# Patient Record
Sex: Female | Born: 1935 | Race: White | Hispanic: No | Smoking: Former smoker
Health system: Southern US, Community
[De-identification: ages and names within clinical notes are randomized; demographics above are authoritative.]

## PROBLEM LIST (undated history)

## (undated) DIAGNOSIS — I10 Essential (primary) hypertension: Secondary | ICD-10-CM

## (undated) DIAGNOSIS — I442 Atrioventricular block, complete: Secondary | ICD-10-CM

## (undated) DIAGNOSIS — I4891 Unspecified atrial fibrillation: Secondary | ICD-10-CM

## (undated) DIAGNOSIS — I255 Ischemic cardiomyopathy: Secondary | ICD-10-CM

## (undated) DIAGNOSIS — G629 Polyneuropathy, unspecified: Secondary | ICD-10-CM

## (undated) DIAGNOSIS — I495 Sick sinus syndrome: Secondary | ICD-10-CM

## (undated) DIAGNOSIS — J449 Chronic obstructive pulmonary disease, unspecified: Secondary | ICD-10-CM

## (undated) DIAGNOSIS — I2510 Atherosclerotic heart disease of native coronary artery without angina pectoris: Secondary | ICD-10-CM

## (undated) DIAGNOSIS — C649 Malignant neoplasm of unspecified kidney, except renal pelvis: Secondary | ICD-10-CM

## (undated) DIAGNOSIS — K219 Gastro-esophageal reflux disease without esophagitis: Secondary | ICD-10-CM

## (undated) HISTORY — DX: Essential (primary) hypertension: I10

## (undated) HISTORY — DX: Ischemic cardiomyopathy: I25.5

## (undated) HISTORY — DX: Atrioventricular block, complete: I44.2

## (undated) HISTORY — DX: Malignant neoplasm of unspecified kidney, except renal pelvis: C64.9

## (undated) HISTORY — DX: Chronic obstructive pulmonary disease, unspecified: J44.9

## (undated) HISTORY — PX: CATARACT EXTRACTION: SUR2

## (undated) HISTORY — DX: Polyneuropathy, unspecified: G62.9

## (undated) HISTORY — DX: Gastro-esophageal reflux disease without esophagitis: K21.9

## (undated) HISTORY — DX: Unspecified atrial fibrillation: I48.91

## (undated) HISTORY — DX: Sick sinus syndrome: I49.5

## (undated) HISTORY — DX: Atherosclerotic heart disease of native coronary artery without angina pectoris: I25.10

---

## 2001-07-14 ENCOUNTER — Inpatient Hospital Stay (HOSPITAL_COMMUNITY): Admission: EM | Admit: 2001-07-14 | Discharge: 2001-07-20 | Payer: Self-pay

## 2001-07-14 ENCOUNTER — Encounter: Payer: Self-pay | Admitting: Internal Medicine

## 2001-07-14 HISTORY — PX: CORONARY ANGIOPLASTY WITH STENT PLACEMENT: SHX49

## 2001-07-15 ENCOUNTER — Encounter (INDEPENDENT_AMBULATORY_CARE_PROVIDER_SITE_OTHER): Payer: Self-pay | Admitting: Cardiology

## 2001-07-17 ENCOUNTER — Encounter: Payer: Self-pay | Admitting: Cardiovascular Disease

## 2003-10-31 ENCOUNTER — Emergency Department: Payer: Self-pay | Admitting: Emergency Medicine

## 2004-08-11 ENCOUNTER — Encounter: Payer: Self-pay | Admitting: Internal Medicine

## 2004-09-15 ENCOUNTER — Emergency Department (HOSPITAL_COMMUNITY): Admission: EM | Admit: 2004-09-15 | Discharge: 2004-09-15 | Payer: Self-pay | Admitting: Emergency Medicine

## 2005-01-28 ENCOUNTER — Inpatient Hospital Stay (HOSPITAL_COMMUNITY): Admission: RE | Admit: 2005-01-28 | Discharge: 2005-02-06 | Payer: Self-pay | Admitting: *Deleted

## 2005-01-29 HISTORY — PX: PERMANENT PACEMAKER INSERTION: SHX6023

## 2005-11-16 ENCOUNTER — Encounter: Payer: Self-pay | Admitting: Internal Medicine

## 2006-02-19 ENCOUNTER — Ambulatory Visit: Payer: Self-pay | Admitting: Internal Medicine

## 2006-02-26 ENCOUNTER — Ambulatory Visit: Payer: Self-pay | Admitting: Internal Medicine

## 2006-02-26 LAB — CONVERTED CEMR LAB
Albumin: 3.9 g/dL (ref 3.5–5.2)
CO2: 33 meq/L — ABNORMAL HIGH (ref 19–32)
Calcium: 10.1 mg/dL (ref 8.4–10.5)
Creatinine, Ser: 0.8 mg/dL (ref 0.4–1.2)
GFR calc Af Amer: 90 mL/min
Glucose, Bld: 102 mg/dL — ABNORMAL HIGH (ref 70–99)
Sodium: 140 meq/L (ref 135–145)
TSH: 0.07 microintl units/mL — ABNORMAL LOW (ref 0.35–5.50)

## 2006-03-05 ENCOUNTER — Ambulatory Visit: Payer: Self-pay | Admitting: Internal Medicine

## 2006-03-20 ENCOUNTER — Ambulatory Visit: Payer: Self-pay | Admitting: Internal Medicine

## 2006-03-27 ENCOUNTER — Ambulatory Visit: Payer: Self-pay | Admitting: Internal Medicine

## 2006-04-17 ENCOUNTER — Ambulatory Visit: Payer: Self-pay | Admitting: Internal Medicine

## 2006-04-29 ENCOUNTER — Ambulatory Visit: Payer: Self-pay | Admitting: Internal Medicine

## 2006-05-10 ENCOUNTER — Telehealth (INDEPENDENT_AMBULATORY_CARE_PROVIDER_SITE_OTHER): Payer: Self-pay | Admitting: *Deleted

## 2006-05-14 ENCOUNTER — Ambulatory Visit: Payer: Self-pay | Admitting: Internal Medicine

## 2006-05-15 ENCOUNTER — Ambulatory Visit: Payer: Self-pay | Admitting: Internal Medicine

## 2006-05-15 DIAGNOSIS — I4891 Unspecified atrial fibrillation: Secondary | ICD-10-CM

## 2006-05-15 LAB — CONVERTED CEMR LAB: INR: 2.6

## 2006-05-19 ENCOUNTER — Inpatient Hospital Stay (HOSPITAL_COMMUNITY): Admission: EM | Admit: 2006-05-19 | Discharge: 2006-05-23 | Payer: Self-pay | Admitting: Emergency Medicine

## 2006-05-19 ENCOUNTER — Ambulatory Visit: Payer: BLUE CROSS/BLUE SHIELD | Admitting: Emergency Medicine

## 2006-05-19 DIAGNOSIS — D0910 Carcinoma in situ of unspecified urinary organ: Secondary | ICD-10-CM | POA: Insufficient documentation

## 2006-05-20 ENCOUNTER — Encounter: Payer: Self-pay | Admitting: Internal Medicine

## 2006-05-21 ENCOUNTER — Encounter (INDEPENDENT_AMBULATORY_CARE_PROVIDER_SITE_OTHER): Payer: Self-pay | Admitting: Specialist

## 2006-05-21 ENCOUNTER — Ambulatory Visit: Payer: Self-pay | Admitting: Internal Medicine

## 2006-05-22 ENCOUNTER — Encounter (INDEPENDENT_AMBULATORY_CARE_PROVIDER_SITE_OTHER): Payer: Self-pay | Admitting: Specialist

## 2006-05-23 ENCOUNTER — Encounter: Payer: Self-pay | Admitting: Internal Medicine

## 2006-05-24 ENCOUNTER — Telehealth (INDEPENDENT_AMBULATORY_CARE_PROVIDER_SITE_OTHER): Payer: Self-pay | Admitting: *Deleted

## 2006-05-27 ENCOUNTER — Ambulatory Visit: Payer: Self-pay | Admitting: Internal Medicine

## 2006-05-27 DIAGNOSIS — G309 Alzheimer's disease, unspecified: Secondary | ICD-10-CM

## 2006-05-27 DIAGNOSIS — K219 Gastro-esophageal reflux disease without esophagitis: Secondary | ICD-10-CM

## 2006-05-27 DIAGNOSIS — R5383 Other fatigue: Secondary | ICD-10-CM

## 2006-05-27 DIAGNOSIS — M810 Age-related osteoporosis without current pathological fracture: Secondary | ICD-10-CM | POA: Insufficient documentation

## 2006-05-27 DIAGNOSIS — I2510 Atherosclerotic heart disease of native coronary artery without angina pectoris: Secondary | ICD-10-CM | POA: Insufficient documentation

## 2006-05-27 DIAGNOSIS — E785 Hyperlipidemia, unspecified: Secondary | ICD-10-CM | POA: Insufficient documentation

## 2006-05-27 DIAGNOSIS — F411 Generalized anxiety disorder: Secondary | ICD-10-CM | POA: Insufficient documentation

## 2006-05-27 DIAGNOSIS — R5381 Other malaise: Secondary | ICD-10-CM

## 2006-05-27 DIAGNOSIS — G589 Mononeuropathy, unspecified: Secondary | ICD-10-CM | POA: Insufficient documentation

## 2006-05-27 DIAGNOSIS — F0280 Dementia in other diseases classified elsewhere without behavioral disturbance: Secondary | ICD-10-CM

## 2006-05-27 DIAGNOSIS — I5022 Chronic systolic (congestive) heart failure: Secondary | ICD-10-CM | POA: Insufficient documentation

## 2006-05-28 LAB — CONVERTED CEMR LAB
BUN: 7 mg/dL (ref 6–23)
Basophils Absolute: 0 10*3/uL (ref 0.0–0.1)
CO2: 33 meq/L — ABNORMAL HIGH (ref 19–32)
Calcium: 9 mg/dL (ref 8.4–10.5)
GFR calc non Af Amer: 103 mL/min
Hemoglobin: 9.7 g/dL — ABNORMAL LOW (ref 12.0–15.0)
Lymphocytes Relative: 25.9 % (ref 12.0–46.0)
MCHC: 33.4 g/dL (ref 30.0–36.0)
MCV: 85.4 fL (ref 78.0–100.0)
Monocytes Absolute: 0.4 10*3/uL (ref 0.2–0.7)
Platelets: 170 10*3/uL (ref 150–400)
RBC: 3.39 M/uL — ABNORMAL LOW (ref 3.87–5.11)
Sodium: 143 meq/L (ref 135–145)
WBC: 2.9 10*3/uL — ABNORMAL LOW (ref 4.5–10.5)

## 2006-05-29 ENCOUNTER — Telehealth: Payer: Self-pay | Admitting: Internal Medicine

## 2006-05-30 ENCOUNTER — Ambulatory Visit: Payer: Self-pay | Admitting: Internal Medicine

## 2006-05-30 DIAGNOSIS — E876 Hypokalemia: Secondary | ICD-10-CM

## 2006-05-30 LAB — CONVERTED CEMR LAB: Potassium: 3.1 meq/L — ABNORMAL LOW (ref 3.5–5.1)

## 2006-06-04 ENCOUNTER — Telehealth (INDEPENDENT_AMBULATORY_CARE_PROVIDER_SITE_OTHER): Payer: Self-pay | Admitting: *Deleted

## 2006-06-06 ENCOUNTER — Encounter: Payer: Self-pay | Admitting: Internal Medicine

## 2006-06-11 ENCOUNTER — Telehealth (INDEPENDENT_AMBULATORY_CARE_PROVIDER_SITE_OTHER): Payer: Self-pay | Admitting: *Deleted

## 2006-06-13 ENCOUNTER — Inpatient Hospital Stay (HOSPITAL_COMMUNITY): Admission: RE | Admit: 2006-06-13 | Discharge: 2006-06-16 | Payer: Self-pay | Admitting: Urology

## 2006-06-13 ENCOUNTER — Encounter (INDEPENDENT_AMBULATORY_CARE_PROVIDER_SITE_OTHER): Payer: Self-pay | Admitting: Urology

## 2006-06-17 ENCOUNTER — Telehealth (INDEPENDENT_AMBULATORY_CARE_PROVIDER_SITE_OTHER): Payer: Self-pay | Admitting: *Deleted

## 2006-06-18 ENCOUNTER — Telehealth (INDEPENDENT_AMBULATORY_CARE_PROVIDER_SITE_OTHER): Payer: Self-pay | Admitting: *Deleted

## 2006-06-19 ENCOUNTER — Ambulatory Visit: Payer: Self-pay | Admitting: Internal Medicine

## 2006-06-19 DIAGNOSIS — I10 Essential (primary) hypertension: Secondary | ICD-10-CM

## 2006-06-25 ENCOUNTER — Encounter: Payer: Self-pay | Admitting: Internal Medicine

## 2006-07-29 ENCOUNTER — Ambulatory Visit: Payer: Self-pay | Admitting: Internal Medicine

## 2006-07-29 ENCOUNTER — Telehealth (INDEPENDENT_AMBULATORY_CARE_PROVIDER_SITE_OTHER): Payer: Self-pay | Admitting: *Deleted

## 2006-07-30 ENCOUNTER — Encounter: Payer: Self-pay | Admitting: Internal Medicine

## 2006-08-07 ENCOUNTER — Telehealth (INDEPENDENT_AMBULATORY_CARE_PROVIDER_SITE_OTHER): Payer: Self-pay | Admitting: *Deleted

## 2006-08-08 ENCOUNTER — Encounter (INDEPENDENT_AMBULATORY_CARE_PROVIDER_SITE_OTHER): Payer: Self-pay | Admitting: Urology

## 2006-08-08 ENCOUNTER — Ambulatory Visit (HOSPITAL_COMMUNITY): Admission: RE | Admit: 2006-08-08 | Discharge: 2006-08-08 | Payer: Self-pay | Admitting: Urology

## 2006-08-14 ENCOUNTER — Ambulatory Visit: Payer: Self-pay | Admitting: Internal Medicine

## 2006-08-14 LAB — CONVERTED CEMR LAB: INR: 1.7

## 2006-08-21 ENCOUNTER — Ambulatory Visit: Payer: Self-pay | Admitting: Internal Medicine

## 2006-08-21 LAB — CONVERTED CEMR LAB: Prothrombin Time: 16.8 s

## 2006-08-28 ENCOUNTER — Encounter: Payer: Self-pay | Admitting: Internal Medicine

## 2006-09-06 ENCOUNTER — Ambulatory Visit: Payer: Self-pay | Admitting: Internal Medicine

## 2006-10-02 ENCOUNTER — Ambulatory Visit: Payer: Self-pay | Admitting: Internal Medicine

## 2006-10-02 LAB — CONVERTED CEMR LAB
INR: 1.3
Prothrombin Time: 14.1 s

## 2006-10-16 ENCOUNTER — Ambulatory Visit: Payer: Self-pay | Admitting: Internal Medicine

## 2006-10-16 LAB — CONVERTED CEMR LAB: Prothrombin Time: 15.3 s

## 2006-10-30 ENCOUNTER — Ambulatory Visit: Payer: Self-pay | Admitting: Internal Medicine

## 2006-10-30 LAB — CONVERTED CEMR LAB: Prothrombin Time: 16.8 s

## 2006-10-31 ENCOUNTER — Ambulatory Visit: Payer: Self-pay | Admitting: Internal Medicine

## 2006-10-31 LAB — CONVERTED CEMR LAB
Albumin: 4.2 g/dL (ref 3.5–5.2)
Basophils Absolute: 0 10*3/uL (ref 0.0–0.1)
Basophils Relative: 0.3 % (ref 0.0–1.0)
CO2: 33 meq/L — ABNORMAL HIGH (ref 19–32)
Calcium: 10 mg/dL (ref 8.4–10.5)
Cholesterol: 164 mg/dL (ref 0–200)
Creatinine, Ser: 1 mg/dL (ref 0.4–1.2)
Eosinophils Absolute: 0.1 10*3/uL (ref 0.0–0.6)
Eosinophils Relative: 1.5 % (ref 0.0–5.0)
Glucose, Bld: 93 mg/dL (ref 70–99)
HCT: 36.9 % (ref 36.0–46.0)
HDL: 55.2 mg/dL (ref >39.0)
LDL Cholesterol: 82 mg/dL (ref 0–99)
MCV: 81.8 fL (ref 78.0–100.0)
Neutro Abs: 1.6 10*3/uL (ref 1.4–7.7)
Neutrophils Relative %: 48 % (ref 43.0–77.0)
Platelets: 134 10*3/uL — ABNORMAL LOW (ref 150–400)
RBC: 4.51 M/uL (ref 3.87–5.11)
TSH: 0.53 microintl units/mL (ref 0.35–5.50)

## 2006-11-27 ENCOUNTER — Ambulatory Visit: Payer: Self-pay | Admitting: Internal Medicine

## 2006-11-27 LAB — CONVERTED CEMR LAB: INR: 2

## 2006-12-16 ENCOUNTER — Ambulatory Visit (HOSPITAL_COMMUNITY): Admission: RE | Admit: 2006-12-16 | Discharge: 2006-12-16 | Payer: Self-pay | Admitting: Urology

## 2006-12-25 ENCOUNTER — Ambulatory Visit: Payer: Self-pay | Admitting: Internal Medicine

## 2006-12-25 LAB — CONVERTED CEMR LAB: INR: 1.1

## 2006-12-26 ENCOUNTER — Encounter: Payer: Self-pay | Admitting: Internal Medicine

## 2006-12-31 ENCOUNTER — Ambulatory Visit: Payer: Self-pay | Admitting: Internal Medicine

## 2007-01-13 ENCOUNTER — Encounter: Payer: Self-pay | Admitting: Internal Medicine

## 2007-01-27 ENCOUNTER — Ambulatory Visit: Payer: Self-pay | Admitting: Internal Medicine

## 2007-01-28 ENCOUNTER — Telehealth: Payer: Self-pay | Admitting: Internal Medicine

## 2007-02-07 ENCOUNTER — Ambulatory Visit: Payer: BLUE CROSS/BLUE SHIELD

## 2007-02-11 ENCOUNTER — Ambulatory Visit: Payer: Self-pay | Admitting: Internal Medicine

## 2007-02-27 ENCOUNTER — Telehealth (INDEPENDENT_AMBULATORY_CARE_PROVIDER_SITE_OTHER): Payer: Self-pay | Admitting: *Deleted

## 2007-03-12 ENCOUNTER — Ambulatory Visit: Payer: Self-pay | Admitting: Internal Medicine

## 2007-03-12 LAB — CONVERTED CEMR LAB
INR: 3.4
Prothrombin Time: 22.4 s

## 2007-03-26 ENCOUNTER — Ambulatory Visit: Payer: Self-pay | Admitting: Internal Medicine

## 2007-03-26 LAB — CONVERTED CEMR LAB: Prothrombin Time: 23 s

## 2007-04-07 ENCOUNTER — Telehealth (INDEPENDENT_AMBULATORY_CARE_PROVIDER_SITE_OTHER): Payer: Self-pay | Admitting: *Deleted

## 2007-04-08 ENCOUNTER — Telehealth (INDEPENDENT_AMBULATORY_CARE_PROVIDER_SITE_OTHER): Payer: Self-pay | Admitting: *Deleted

## 2007-04-10 ENCOUNTER — Ambulatory Visit: Payer: Self-pay | Admitting: Internal Medicine

## 2007-05-08 ENCOUNTER — Ambulatory Visit: Payer: Self-pay | Admitting: Internal Medicine

## 2007-05-08 LAB — CONVERTED CEMR LAB

## 2007-06-05 ENCOUNTER — Ambulatory Visit: Payer: Self-pay | Admitting: Internal Medicine

## 2007-06-09 LAB — CONVERTED CEMR LAB: Prothrombin Time: 16.7 s

## 2007-06-18 ENCOUNTER — Ambulatory Visit (HOSPITAL_COMMUNITY): Admission: RE | Admit: 2007-06-18 | Discharge: 2007-06-18 | Payer: Self-pay | Admitting: Urology

## 2007-06-19 ENCOUNTER — Ambulatory Visit: Payer: Self-pay | Admitting: Internal Medicine

## 2007-06-19 LAB — CONVERTED CEMR LAB: Prothrombin Time: 19.9 s

## 2007-06-27 ENCOUNTER — Encounter: Payer: Self-pay | Admitting: Internal Medicine

## 2007-07-10 ENCOUNTER — Encounter: Payer: Self-pay | Admitting: Internal Medicine

## 2007-07-17 ENCOUNTER — Ambulatory Visit: Payer: Self-pay | Admitting: Internal Medicine

## 2007-07-17 LAB — CONVERTED CEMR LAB: INR: 3.6

## 2007-07-31 ENCOUNTER — Ambulatory Visit: Payer: Self-pay | Admitting: Internal Medicine

## 2007-07-31 LAB — CONVERTED CEMR LAB: INR: 2.8

## 2007-08-14 ENCOUNTER — Ambulatory Visit: Payer: Self-pay | Admitting: Internal Medicine

## 2007-08-14 LAB — CONVERTED CEMR LAB
INR: 4.4 — ABNORMAL HIGH (ref 0.8–1.0)
Prothrombin Time: 28.7 s
Prothrombin Time: 43.9 s — ABNORMAL HIGH (ref 10.9–13.3)

## 2007-08-18 ENCOUNTER — Telehealth (INDEPENDENT_AMBULATORY_CARE_PROVIDER_SITE_OTHER): Payer: Self-pay | Admitting: *Deleted

## 2007-08-18 ENCOUNTER — Ambulatory Visit: Payer: Self-pay | Admitting: Internal Medicine

## 2007-08-18 LAB — CONVERTED CEMR LAB
INR: 1.3
Prothrombin Time: 13.9 s

## 2007-08-25 ENCOUNTER — Ambulatory Visit: Payer: Self-pay | Admitting: Internal Medicine

## 2007-08-25 LAB — CONVERTED CEMR LAB: INR: 1.2

## 2007-09-08 ENCOUNTER — Ambulatory Visit: Payer: Self-pay | Admitting: Internal Medicine

## 2007-09-22 ENCOUNTER — Ambulatory Visit: Payer: Self-pay | Admitting: Internal Medicine

## 2007-09-22 LAB — CONVERTED CEMR LAB
INR: 1.5
Prothrombin Time: 15.1 s

## 2007-10-06 ENCOUNTER — Ambulatory Visit: Payer: Self-pay | Admitting: Internal Medicine

## 2007-10-06 LAB — CONVERTED CEMR LAB: INR: 1.8

## 2007-10-13 ENCOUNTER — Encounter: Payer: Self-pay | Admitting: Internal Medicine

## 2007-10-20 ENCOUNTER — Ambulatory Visit: Payer: Self-pay | Admitting: Internal Medicine

## 2007-10-20 LAB — CONVERTED CEMR LAB
INR: 3.9
Prothrombin Time: 24 s

## 2007-10-27 ENCOUNTER — Telehealth (INDEPENDENT_AMBULATORY_CARE_PROVIDER_SITE_OTHER): Payer: Self-pay | Admitting: *Deleted

## 2007-11-04 ENCOUNTER — Ambulatory Visit: Payer: Self-pay | Admitting: Internal Medicine

## 2007-11-18 ENCOUNTER — Ambulatory Visit: Payer: Self-pay | Admitting: Internal Medicine

## 2007-11-18 LAB — CONVERTED CEMR LAB
INR: 1.6
Prothrombin Time: 15.4 s

## 2007-11-19 LAB — CONVERTED CEMR LAB
Albumin: 4.1 g/dL (ref 3.5–5.2)
Basophils Absolute: 0 10*3/uL (ref 0.0–0.1)
Bilirubin, Direct: 0.1 mg/dL (ref 0.0–0.3)
CO2: 30 meq/L (ref 19–32)
Chloride: 104 meq/L (ref 96–112)
Cholesterol: 163 mg/dL (ref 0–200)
Eosinophils Absolute: 0 10*3/uL (ref 0.0–0.7)
Eosinophils Relative: 1.2 % (ref 0.0–5.0)
GFR calc Af Amer: 69 mL/min
GFR calc non Af Amer: 57 mL/min
HCT: 38 % (ref 36.0–46.0)
HDL: 47.7 mg/dL (ref >39.0)
MCHC: 33.2 g/dL (ref 30.0–36.0)
MCV: 88.1 fL (ref 78.0–100.0)
Monocytes Absolute: 0.3 10*3/uL (ref 0.1–1.0)
Platelets: 124 10*3/uL — ABNORMAL LOW (ref 150–400)
Potassium: 4.8 meq/L (ref 3.5–5.1)
Total Protein: 7 g/dL (ref 6.0–8.3)
Triglycerides: 145 mg/dL (ref 0–149)
VLDL: 29 mg/dL (ref 0–40)
WBC: 3.7 10*3/uL — ABNORMAL LOW (ref 4.5–10.5)

## 2007-12-02 ENCOUNTER — Ambulatory Visit: Payer: Self-pay | Admitting: Internal Medicine

## 2007-12-02 LAB — CONVERTED CEMR LAB
INR: 1.5
Prothrombin Time: 15.1 s

## 2007-12-16 ENCOUNTER — Telehealth: Payer: Self-pay | Admitting: Internal Medicine

## 2007-12-16 ENCOUNTER — Ambulatory Visit: Payer: Self-pay | Admitting: Internal Medicine

## 2007-12-16 LAB — CONVERTED CEMR LAB: Prothrombin Time: 14.3 s

## 2007-12-22 ENCOUNTER — Ambulatory Visit (HOSPITAL_COMMUNITY): Admission: RE | Admit: 2007-12-22 | Discharge: 2007-12-22 | Payer: Self-pay | Admitting: Urology

## 2007-12-26 ENCOUNTER — Encounter: Payer: Self-pay | Admitting: Internal Medicine

## 2007-12-30 ENCOUNTER — Ambulatory Visit: Payer: Self-pay | Admitting: Internal Medicine

## 2007-12-30 ENCOUNTER — Ambulatory Visit: Payer: Self-pay | Admitting: Family Medicine

## 2007-12-30 DIAGNOSIS — N390 Urinary tract infection, site not specified: Secondary | ICD-10-CM | POA: Insufficient documentation

## 2008-01-05 ENCOUNTER — Telehealth: Payer: Self-pay | Admitting: Internal Medicine

## 2008-01-06 ENCOUNTER — Ambulatory Visit: Payer: Self-pay | Admitting: Internal Medicine

## 2008-01-06 DIAGNOSIS — K625 Hemorrhage of anus and rectum: Secondary | ICD-10-CM

## 2008-01-13 ENCOUNTER — Ambulatory Visit: Payer: Self-pay | Admitting: Internal Medicine

## 2008-01-13 LAB — CONVERTED CEMR LAB
INR: 1.9
Prothrombin Time: 16.9 s

## 2008-01-27 ENCOUNTER — Ambulatory Visit: Payer: Self-pay | Admitting: Internal Medicine

## 2008-01-27 LAB — CONVERTED CEMR LAB
INR: 1.3
Prothrombin Time: 14.3 s

## 2008-02-04 ENCOUNTER — Ambulatory Visit: Payer: Self-pay | Admitting: Internal Medicine

## 2008-02-05 LAB — CONVERTED CEMR LAB
AST: 18 units/L (ref 0–37)
Albumin: 3.9 g/dL (ref 3.5–5.2)
Basophils Absolute: 0 10*3/uL (ref 0.0–0.1)
Basophils Relative: 0.5 % (ref 0.0–3.0)
Cholesterol: 144 mg/dL (ref 0–200)
Eosinophils Absolute: 0.1 10*3/uL (ref 0.0–0.7)
HCT: 36.6 % (ref 36.0–46.0)
Hemoglobin: 12.5 g/dL (ref 12.0–15.0)
Lymphocytes Relative: 31 % (ref 12.0–46.0)
MCHC: 34.1 g/dL (ref 30.0–36.0)
MCV: 87.4 fL (ref 78.0–100.0)
Monocytes Absolute: 0.3 10*3/uL (ref 0.1–1.0)
Neutro Abs: 2.4 10*3/uL (ref 1.4–7.7)
RBC: 4.19 M/uL (ref 3.87–5.11)
TSH: 0.62 microintl units/mL (ref 0.35–5.50)

## 2008-02-18 ENCOUNTER — Ambulatory Visit: Payer: Self-pay | Admitting: Internal Medicine

## 2008-02-24 ENCOUNTER — Ambulatory Visit: Payer: Self-pay | Admitting: Family Medicine

## 2008-02-24 DIAGNOSIS — S2239XA Fracture of one rib, unspecified side, initial encounter for closed fracture: Secondary | ICD-10-CM

## 2008-03-02 ENCOUNTER — Encounter: Payer: Self-pay | Admitting: Internal Medicine

## 2008-03-02 ENCOUNTER — Telehealth: Payer: Self-pay | Admitting: Internal Medicine

## 2008-03-03 ENCOUNTER — Telehealth: Payer: Self-pay | Admitting: Internal Medicine

## 2008-03-04 ENCOUNTER — Telehealth: Payer: Self-pay | Admitting: Internal Medicine

## 2008-03-11 ENCOUNTER — Telehealth: Payer: Self-pay | Admitting: Internal Medicine

## 2008-03-11 ENCOUNTER — Encounter: Payer: Self-pay | Admitting: Internal Medicine

## 2008-03-12 ENCOUNTER — Encounter: Payer: Self-pay | Admitting: Internal Medicine

## 2008-03-15 ENCOUNTER — Telehealth: Payer: Self-pay | Admitting: Internal Medicine

## 2008-04-02 ENCOUNTER — Telehealth: Payer: Self-pay | Admitting: Internal Medicine

## 2008-04-13 ENCOUNTER — Ambulatory Visit: Payer: Self-pay | Admitting: Internal Medicine

## 2008-04-13 LAB — CONVERTED CEMR LAB: Prothrombin Time: 17.1 s

## 2008-04-21 ENCOUNTER — Encounter: Payer: Self-pay | Admitting: Internal Medicine

## 2008-05-04 ENCOUNTER — Ambulatory Visit: Payer: Self-pay | Admitting: Internal Medicine

## 2008-05-04 DIAGNOSIS — R32 Unspecified urinary incontinence: Secondary | ICD-10-CM

## 2008-05-11 ENCOUNTER — Ambulatory Visit: Payer: Self-pay | Admitting: Internal Medicine

## 2008-05-11 LAB — CONVERTED CEMR LAB: Prothrombin Time: 19.7 s

## 2008-06-08 ENCOUNTER — Ambulatory Visit: Payer: Self-pay | Admitting: Internal Medicine

## 2008-06-08 LAB — CONVERTED CEMR LAB: INR: 1.9

## 2008-06-14 ENCOUNTER — Ambulatory Visit (HOSPITAL_COMMUNITY): Admission: RE | Admit: 2008-06-14 | Discharge: 2008-06-14 | Payer: Self-pay | Admitting: Urology

## 2008-07-06 ENCOUNTER — Ambulatory Visit: Payer: Self-pay | Admitting: Family Medicine

## 2008-07-06 LAB — CONVERTED CEMR LAB: Prothrombin Time: 17.3 s

## 2008-07-29 ENCOUNTER — Ambulatory Visit: Payer: Self-pay | Admitting: Internal Medicine

## 2008-07-29 LAB — CONVERTED CEMR LAB: INR: 2.2

## 2008-08-15 ENCOUNTER — Inpatient Hospital Stay (HOSPITAL_COMMUNITY): Admission: EM | Admit: 2008-08-15 | Discharge: 2008-08-23 | Payer: Self-pay | Admitting: Emergency Medicine

## 2008-08-15 ENCOUNTER — Ambulatory Visit: Payer: Self-pay | Admitting: Internal Medicine

## 2008-08-16 ENCOUNTER — Telehealth: Payer: Self-pay | Admitting: Internal Medicine

## 2008-08-22 ENCOUNTER — Ambulatory Visit: Payer: Self-pay | Admitting: Oncology

## 2008-08-22 ENCOUNTER — Encounter: Payer: Self-pay | Admitting: Internal Medicine

## 2008-08-26 ENCOUNTER — Ambulatory Visit: Payer: Self-pay | Admitting: Gastroenterology

## 2008-08-26 ENCOUNTER — Telehealth: Payer: Self-pay | Admitting: Internal Medicine

## 2008-08-26 ENCOUNTER — Ambulatory Visit: Payer: Self-pay | Admitting: Oncology

## 2008-08-28 ENCOUNTER — Ambulatory Visit: Payer: BLUE CROSS/BLUE SHIELD | Admitting: Family Medicine

## 2008-08-31 ENCOUNTER — Emergency Department: Payer: BLUE CROSS/BLUE SHIELD | Admitting: Emergency Medicine

## 2008-09-27 ENCOUNTER — Ambulatory Visit: Payer: Self-pay | Admitting: Oncology

## 2008-09-29 ENCOUNTER — Encounter: Payer: Self-pay | Admitting: Internal Medicine

## 2008-09-29 LAB — CBC & DIFF AND RETIC
Basophils Absolute: 0 10*3/uL (ref 0.0–0.1)
EOS%: 5 % (ref 0.0–7.0)
HCT: 36.8 % (ref 34.8–46.6)
HGB: 11.8 g/dL (ref 11.6–15.9)
Immature Retic Fract: 6.9 % (ref 0.00–10.70)
MCH: 28.2 pg (ref 25.1–34.0)
MCV: 88 fL (ref 79.5–101.0)
NEUT%: 69.3 % (ref 38.4–76.8)
Platelets: 193 10*3/uL (ref 145–400)
lymph#: 1 10*3/uL (ref 0.9–3.3)

## 2008-09-29 LAB — MORPHOLOGY

## 2008-09-29 LAB — ERYTHROCYTE SEDIMENTATION RATE: Sed Rate: 22 mm/hr (ref 0–30)

## 2008-09-30 LAB — COMPREHENSIVE METABOLIC PANEL
Alkaline Phosphatase: 71 U/L (ref 39–117)
BUN: 15 mg/dL (ref 6–23)
Glucose, Bld: 130 mg/dL — ABNORMAL HIGH (ref 70–99)
Sodium: 140 mEq/L (ref 135–145)
Total Bilirubin: 0.6 mg/dL (ref 0.3–1.2)

## 2008-09-30 LAB — LACTATE DEHYDROGENASE: LDH: 228 U/L (ref 94–250)

## 2009-09-12 IMAGING — CR DG CHEST 2V
2 series · 2 of 2 positions shown · non-contrast
Comparison: 05/21/06.

CLINICAL DATA: Renal cell cancer. Assess for metastatic disease.
 CHEST - 2 VIEW:

[w chest pa]
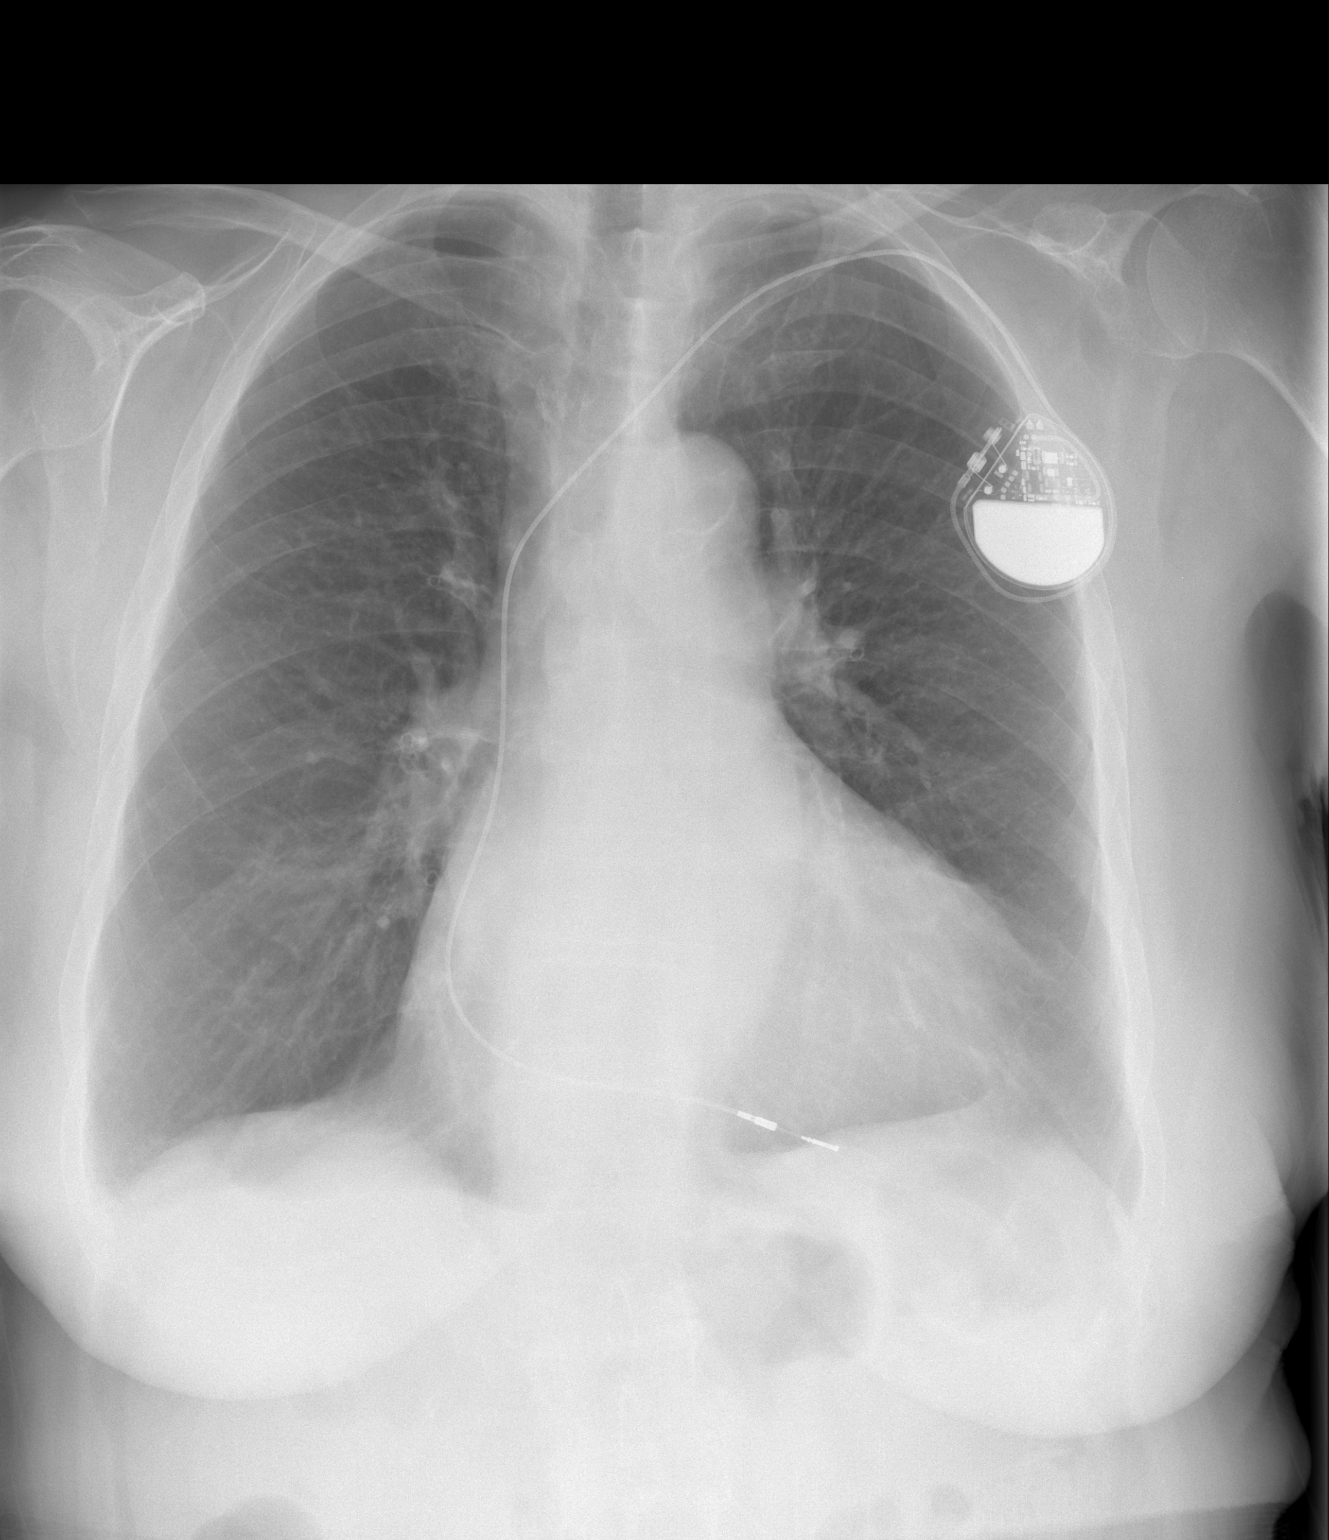

[w chest lat]
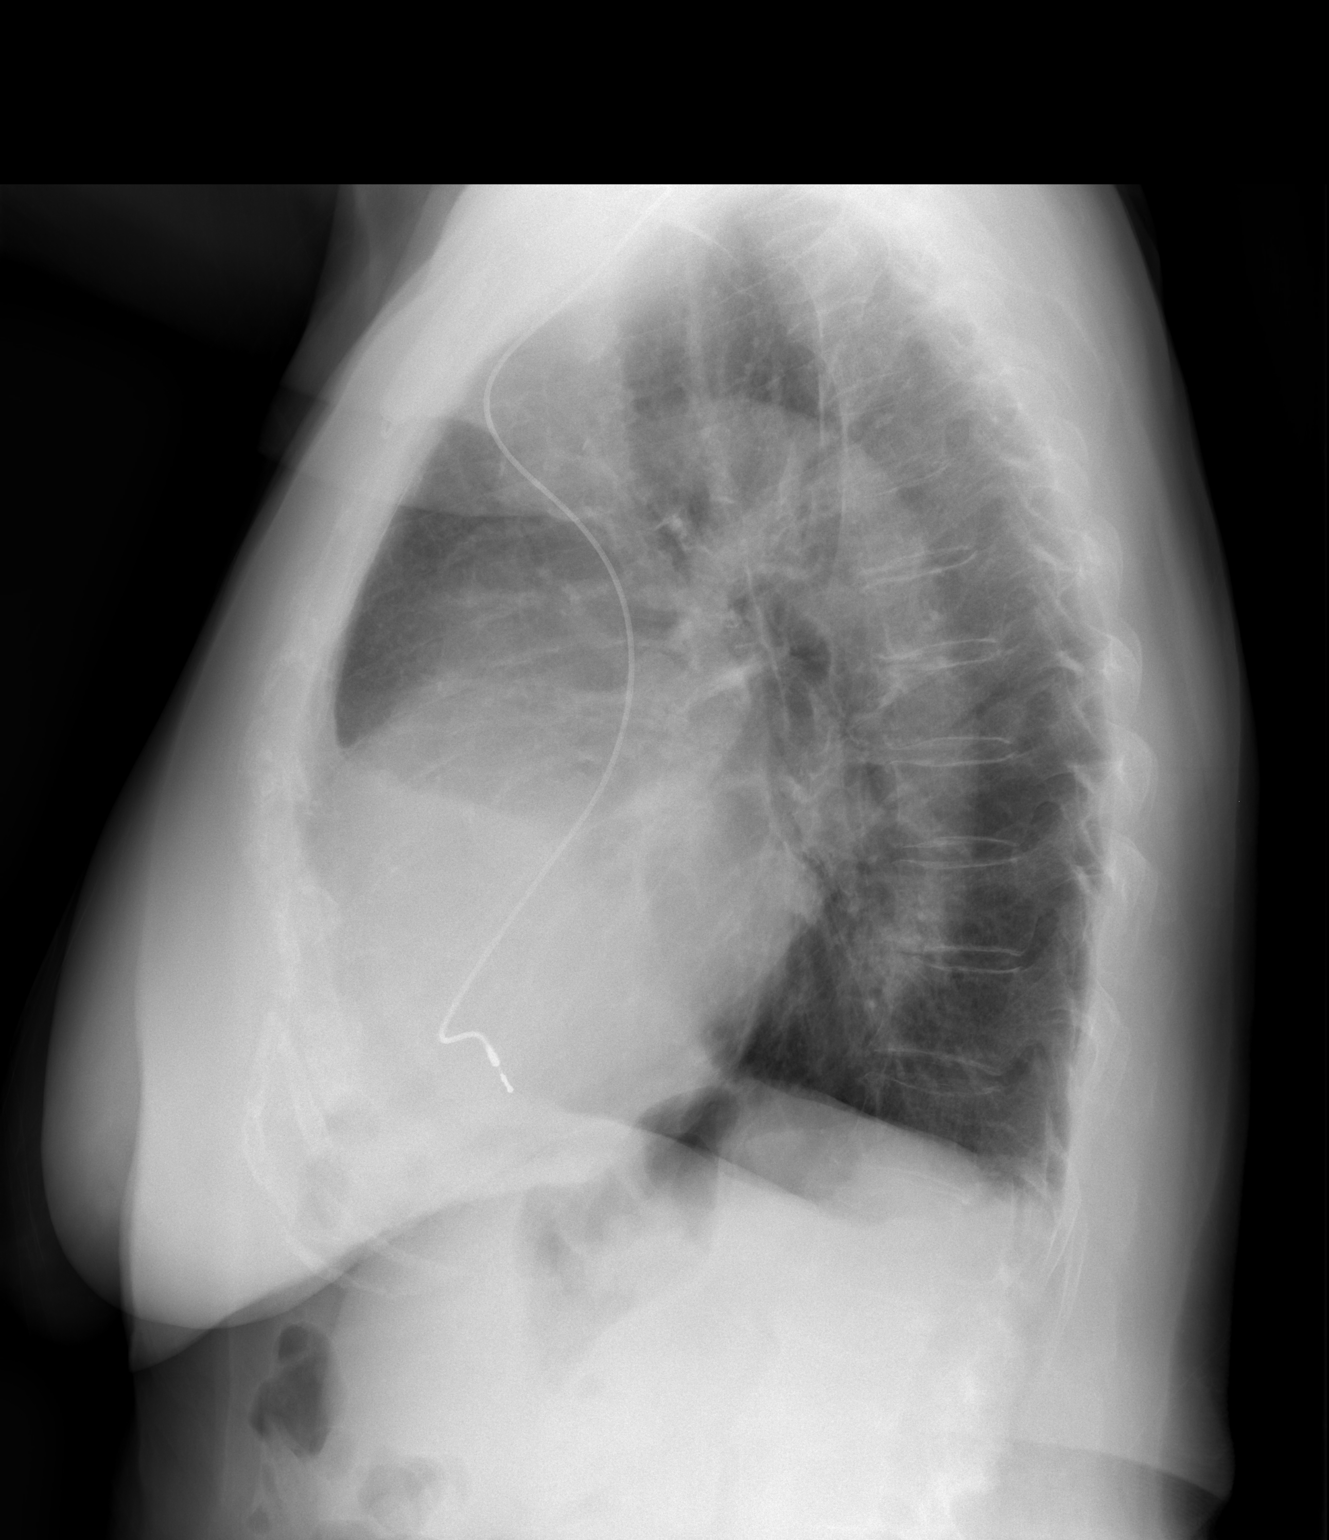

[2 of 2 positions shown; findings below may reference images not displayed]

FINDINGS: Single lead pacemaker appears the same. Cardiomegaly persists.  Pleural and parenchymal scarring on the left appear the same. No sign of metastatic disease or any active process.
IMPRESSION: 1.  No evidence of metastatic disease.
 2.  Cardiomegaly and pleural scarring on the left.

## 2010-01-29 ENCOUNTER — Encounter: Payer: Self-pay | Admitting: Internal Medicine

## 2010-03-16 HISTORY — PX: NM MYOCAR PERF WALL MOTION: HXRAD629

## 2010-04-15 LAB — BASIC METABOLIC PANEL
BUN: 16 mg/dL (ref 6–23)
CO2: 29 mEq/L (ref 19–32)
Chloride: 103 mEq/L (ref 96–112)
Creatinine, Ser: 1.07 mg/dL (ref 0.4–1.2)
Glucose, Bld: 125 mg/dL — ABNORMAL HIGH (ref 70–99)
Potassium: 3.9 mEq/L (ref 3.5–5.1)

## 2010-04-15 LAB — CBC
HCT: 23.6 % — ABNORMAL LOW (ref 36.0–46.0)
HCT: 26.1 % — ABNORMAL LOW (ref 36.0–46.0)
HCT: 29.7 % — ABNORMAL LOW (ref 36.0–46.0)
HCT: 35.2 % — ABNORMAL LOW (ref 36.0–46.0)
HCT: 36.5 % (ref 36.0–46.0)
MCHC: 34.7 g/dL (ref 30.0–36.0)
MCHC: 33.6 g/dL (ref 30.0–36.0)
MCHC: 34.1 g/dL (ref 30.0–36.0)
MCHC: 34.2 g/dL (ref 30.0–36.0)
MCHC: 34.7 g/dL (ref 30.0–36.0)
MCV: 84.7 fL (ref 78.0–100.0)
MCV: 87.3 fL (ref 78.0–100.0)
MCV: 87.5 fL (ref 78.0–100.0)
MCV: 87.7 fL (ref 78.0–100.0)
Platelets: 145 10*3/uL — ABNORMAL LOW (ref 150–400)
Platelets: 109 10*3/uL — ABNORMAL LOW (ref 150–400)
Platelets: 110 10*3/uL — ABNORMAL LOW (ref 150–400)
Platelets: 126 10*3/uL — ABNORMAL LOW (ref 150–400)
Platelets: 134 10*3/uL — ABNORMAL LOW (ref 150–400)
RBC: 2.69 MIL/uL — ABNORMAL LOW (ref 3.87–5.11)
RBC: 2.98 MIL/uL — ABNORMAL LOW (ref 3.87–5.11)
RDW: 13.2 % (ref 11.5–15.5)
RDW: 12.9 % (ref 11.5–15.5)
RDW: 13.1 % (ref 11.5–15.5)
WBC: 3.2 10*3/uL — ABNORMAL LOW (ref 4.0–10.5)
WBC: 4.7 10*3/uL (ref 4.0–10.5)
WBC: 5.2 10*3/uL (ref 4.0–10.5)
WBC: 5.4 10*3/uL (ref 4.0–10.5)

## 2010-04-15 LAB — APTT
aPTT: 50 seconds — ABNORMAL HIGH (ref 24–37)
aPTT: 29 seconds (ref 24–37)
aPTT: 34 seconds (ref 24–37)
aPTT: 41 seconds — ABNORMAL HIGH (ref 24–37)
aPTT: 43 seconds — ABNORMAL HIGH (ref 24–37)
aPTT: 51 seconds — ABNORMAL HIGH (ref 24–37)

## 2010-04-15 LAB — DIFFERENTIAL
Basophils Absolute: 0 10*3/uL (ref 0.0–0.1)
Eosinophils Absolute: 0.1 10*3/uL (ref 0.0–0.7)
Eosinophils Relative: 2 % (ref 0–5)
Eosinophils Relative: 0 % (ref 0–5)
Lymphocytes Relative: 14 % (ref 12–46)
Lymphs Abs: 0.8 10*3/uL (ref 0.7–4.0)
Lymphs Abs: 0.7 10*3/uL (ref 0.7–4.0)
Monocytes Absolute: 0.4 10*3/uL (ref 0.1–1.0)
Monocytes Relative: 11 % (ref 3–12)
Neutro Abs: 4.4 10*3/uL (ref 1.7–7.7)
Neutrophils Relative %: 61 % (ref 43–77)

## 2010-04-15 LAB — COMPREHENSIVE METABOLIC PANEL
ALT: 20 U/L (ref 0–35)
AST: 21 U/L (ref 0–37)
AST: 21 U/L (ref 0–37)
Albumin: 2.6 g/dL — ABNORMAL LOW (ref 3.5–5.2)
Albumin: 4 g/dL (ref 3.5–5.2)
BUN: 23 mg/dL (ref 6–23)
CO2: 27 mEq/L (ref 19–32)
Calcium: 9 mg/dL (ref 8.4–10.5)
Calcium: 9.3 mg/dL (ref 8.4–10.5)
Chloride: 101 mEq/L (ref 96–112)
Creatinine, Ser: 1.02 mg/dL (ref 0.4–1.2)
GFR calc Af Amer: >60 mL/min (ref >60)
GFR calc Af Amer: >60 mL/min (ref >60)
GFR calc non Af Amer: 52 mL/min — ABNORMAL LOW (ref >60)
Sodium: 136 mEq/L (ref 135–145)
Total Bilirubin: 0.7 mg/dL (ref 0.3–1.2)
Total Protein: 5.7 g/dL — ABNORMAL LOW (ref 6.0–8.3)

## 2010-04-15 LAB — RETICULOCYTES
RBC.: 3.49 MIL/uL — ABNORMAL LOW (ref 3.87–5.11)
Retic Count, Absolute: 35.8 10*3/uL (ref 19.0–186.0)
Retic Ct Pct: 1.3 % (ref 0.4–3.1)

## 2010-04-15 LAB — PREPARE FRESH FROZEN PLASMA

## 2010-04-15 LAB — CLOSTRIDIUM DIFFICILE EIA

## 2010-04-15 LAB — PROTIME-INR
INR: 2.9 — ABNORMAL HIGH (ref 0.00–1.49)
Prothrombin Time: 15.5 seconds — ABNORMAL HIGH (ref 11.6–15.2)
Prothrombin Time: 19 seconds — ABNORMAL HIGH (ref 11.6–15.2)
Prothrombin Time: 26.5 seconds — ABNORMAL HIGH (ref 11.6–15.2)
Prothrombin Time: 29.8 seconds — ABNORMAL HIGH (ref 11.6–15.2)

## 2010-04-15 LAB — CROSSMATCH: ABO/RH(D): A POS

## 2010-04-15 LAB — VITAMIN B12: Vitamin B-12: 295 pg/mL (ref 211–911)

## 2010-04-15 LAB — DIRECT ANTIGLOBULIN TEST (NOT AT ARMC)
DAT, IgG: NEGATIVE
DAT, complement: NEGATIVE

## 2010-04-15 LAB — GLUCOSE, CAPILLARY: Glucose-Capillary: 91 mg/dL (ref 70–99)

## 2010-04-15 LAB — FECAL LACTOFERRIN, QUANT: Fecal Lactoferrin: POSITIVE

## 2010-05-23 NOTE — Discharge Summary (Signed)
NAMESNOW, PEOPLES               ACCOUNT NO.:  [id number]   MEDICAL RECORD NO.:  [id number]          PATIENT TYPE:  INP   LOCATION:  4703                         FACILITY:  [facility]   PHYSICIAN:  Gordy Savers, MDDATE OF BIRTH:  1928/10/27   DATE OF ADMISSION:  05/19/2006  DATE OF DISCHARGE:  05/23/2006                               DISCHARGE SUMMARY   DISCHARGE DIAGNOSES:  1. Gross hematuria with left renal mass on CT, likely renal cell      carcinoma.  Plan for a laparoscopic left radical nephrectomy as an      outpatient per Dr. Crecencio Mc.  2. Thyroid nodules noted on ultrasound at previous practice per      daughter, at which time it was recommended that the patient have a      follow-up ultrasound performed in 6 months, will defer to the      patient's primary care  3. Chronic diarrhea/dysphagia status post colonoscopy and endoscopy      performed by Dr. Stan Head on May 22, 2006.  Status post empiric      dilation for dysphagia.   HISTORY OF PRESENT ILLNESS:  Ms. Gadway is a 77 year old white female who  was admitted on May 19, 2006, with a chief complaint of gross hematuria.  She as a history of atrial fibrillation and has been maintained on  Coumadin prior to this admission.  She initially presented to [facility] where they recommended possible hospitalization.  She  was admitted for further evaluation and treatment.   PAST MEDICAL HISTORY:  1. Atrial fibrillation on Coumadin.  2. History of bradycardia status post permanent pacemaker placement.  3. History of coronary artery disease.  4. History of MI.  5. Status post PTCA 2003 with stent per Dr. Tresa Endo, her cardiologist.  6. Depression/anxiety.  7. Memory loss/dementia.  8. History of peripheral neuropathy.  9. Osteoporosis.  10.Bilateral cataract surgery.   HOSPITAL COURSE:  PROBLEM #1 - GROSS HEMATURIA:  The patient was  admitted and was seen in consultation by Dr. Annabell Howells of Urology.   She  underwent a CT of the abdomen and pelvis which noted a left renal mass  most consistent with renal cell carcinoma.  Incidentally, a right  ovarian dermoid was noted.  This was followed by cystoscopy.  It was  felt that the patient would need a nephrectomy.  At this time plan is  for the patient to undergo a nephrectomy which will be performed  laparoscopically by Dr. Crecencio Mc, and at this time will continue to  maintain the patient off of Coumadin but will add aspirin with plan to  hold aspirin for 5 days prior to surgery.   PROBLEM #2 -  DYSPHASIA/CHRONIC DIARRHEA:  The patient was currently  undergoing outpatient evaluation for these issues at [facility],  however, they were postponed due to the patient being on Coumadin.  As  her Coumadin was held during this hospitalization, we have requested GI  to perform these tests while the patient was off of Coumadin.  Endoscopy  was normal.  She underwent  an empiric dilation if it would help with her  dysphasia.  Colonoscopy noted internal hemorrhoids, otherwise was  unremarkable.   DISCHARGE LABORATORY DATA:  Hemoglobin 9, hematocrit 26.5.  BUN 7,  creatinine 0.58.   DISCHARGE MEDICATIONS:  1. Lexapro 10 mg p.o. daily.  2. Aricept 5 mg p.o. daily in the evening.  3. Norvasc 5 mg p.o. daily.  4. Vytorin 10/40 1 tablet p.o. daily.  5. Toprol XL 50 mg p.o. daily.  6. Diovan 160 mg p.o. daily as before.  7. Imodium as needed for diarrhea.  8. Lasix 80 mg p.o. daily.  9. Aspirin 325 mg p.o. daily.  The patient is instructed to hold aspirin 5 days prior to surgery.  In  addition, the patient is instructed to hold Coumadin until further  instructions.   DISPOSITION:  The patient will be discharged to home.   FOLLOW UP:  The patient is to follow up with Dr. Crecencio Mc next week  and contact the office for time.  She is also instructed to follow up  with Dr. Tillman Abide in 1-2 weeks.      Sandford Craze, NP       Gordy Savers, MD  Electronically Signed    MO/MEDQ  D:  05/23/2006  T:  05/23/2006  Job:  [id number]   cc:   Crecencio Mc, M.D.  Karie Schwalbe, MD  Excell Seltzer. Annabell Howells, M.D.

## 2010-05-23 NOTE — Consult Note (Signed)
NAMESEVILLE, BRICK               ACCOUNT NO.:  [id number]   MEDICAL RECORD NO.:  [id number]          PATIENT TYPE:  INP   LOCATION:  4735                         FACILITY:  [facility]   PHYSICIAN:  Genene Churn. Granfortuna, M.D.DATE OF BIRTH:  02-May-1928   DATE OF CONSULTATION:  08/22/2008  DATE OF DISCHARGE:                                 CONSULTATION   This is a hematology consultation requested to evaluate this lady for a  sudden unexplained fall in her hemoglobin with no obvious signs of  bleeding.   Mrs. Seres is a 77 year old woman with known cardiac disease status post  MI in 2003 and subsequent development of sick sinus syndrome requiring  permanent pacemaker placement in January 2007.  She is on chronic  Coumadin anticoagulation for atrial fibrillation.  She has developed an  ischemic cardiomyopathy with an estimated ejection fraction of 30-35%.   She presented in May 2008 with gross hematuria and was found to have a  cancer of the left kidney.  She underwent a laparoscopic left  nephrectomy on June 13, 2006 by Dr. Heloise Purpura.   She was admitted here on August 15, 2008 after she tripped and fell at  home and fractured her right ankle.  She was felt to be a surgical  candidate.  Her Coumadin was held.  She was bridged with Lovenox.  She  underwent an open reduction and internal fixation procedure on the right  ankle on August 18, 2008.  She tolerated the surgery well.  Estimated  blood loss was minimal at about 150 mL.   Preop on August 15, 2008 hemoglobin 12.7, hematocrit 36.5, white count  5400, platelets 134,000.  Post hydration on August 17, 2008 hemoglobin  was 11.8, hematocrit 35.  Postop on August 19, 2008 hemoglobin down to  10 grams, then 8.9 grams by August 21, 2008 and 8 grams today.  Over the  same interval white count fell from 5400 to3200, platelets have  fluctuated from initial value of 134,000 to 109, 000 on August 19, 2008  and back up to 120,000 today.   Previous blood counts recorded from August 07, 2006 hemoglobin 11.7,  hematocrit 34, white count 2900, platelets 141,000.  On July 07, 2005,  hemoglobin 11, hematocrit 32, white count 2900, platelets 138,000.  On  January 28, 2005 hemoglobin 13, hematocrit 39, white count 3800 and  platelets 169,000.   She was typed in crossmatch for blood today.  There were no abnormal  antibodies detected.  Blood type is A, Rh positive.  On admission,  bilirubin was normal at 0.7.  Additional lab today with B12 295, folic  acid 10.4, iron 37 slightly decreased and ferritin 111.   She is still on Lovenox 80 mg q.12 hours and Coumadin has been resumed  at 6 mg nightly.   ADDITIONAL MEDICATIONS:  1. Norvasc 5 mg daily.  2. Aricept 5 mg daily.  3. Lexapro 10 mg daily.  4. Benicar 20 mg b.i.d.  5. Protonix 40 mg b.i.d.   NEW MEDICATIONS:  Protonix, perioperative Ancef, p.r.n. Ambien,  OxyContin, and Lovenox.   ALLERGIES:  No known medication allergies.   No blood transfusions given during this admission.   Yesterday, she developed gastritis with nausea, vomiting and diarrhea.  She does have some history of irritable bowel syndrome.  She did have a  colonoscopy in 2008 around the time of her diagnosis of kidney cancer,  which according to her daughter did not show any gross pathology.   FAMILY HISTORY:  There is no family history of any bleeding disorders or  anemias.   PHYSICAL EXAMINATION:  GENERAL:  An elderly woman with poor memory.  Most of her past medical history is provided by her daughter who  accompanies her today and from the chart record.  VITAL SIGNS:  She is afebrile and has been afebrile since admission.  Blood pressure is 113/54, pulse 67 and regular, respirations 20.  SKIN:  Pale.  There is a 2 x 2 cm ecchymosis on the right flank.  There  are some small ecchymoses at sites of venipuncture.  HEENT:  The pharynx no erythema or exudate.  LUNGS:  Clear.  HEART:  Regular cardiac  rhythm.  No murmur.  Pacemaker battery in the  subcutaneous tissues left subclavian position.  ABDOMEN:  Soft, obese, nontender or minimally tender in the epigastric  region.  No obvious mass or organomegaly.  EXTREMITIES:  Left lower  extremity no calf, edema or swelling.  Right lower extremity she has a  cast on up to just below her knee.  NEUROLOGIC:  Grossly normal except  for poor memory.   Review of the peripheral blood from today shows normochromic normocytic  red blood cells.  No polychromasia.  No schistocytes.  Neutrophils  appear hypogranular.  There was a rare atypical monocyte and occasional  lymphocyte has cytoplasmic projections.  These findings are nonspecific.   IMPRESSION:  1. Subacute fall in hemoglobin since August 18, 2008 surgery with most      of the fall over the last 48 hours.   Although there has been no gross clinical bleeding, she did have some  surgical losses and she has developed a gastritis since she has been in  the hospital.  There is no gross evidence for any acute hemolytic  process or any major hemorrhage.  She has been on anticoagulation since  admission which may also contribute to some blood loss.   I cannot rule out a medication effect, but she does not appear to be on  any of the usual offending medications that cause anemia.   1. Chronic mild fluctuating pancytopenia.  My guess is that she may      have a early, mild, myelodysplastic syndrome given the mild      pancytopenia and hypogranular neutrophils on peripheral blood film.      I suspect that her white count usually runs low.  It has been as      low as 2900 in the past and that with the stress of surgery she has      had a transient increase into the normal range.  Although her      platelet count is also mildly decreased, this has also fluctuated      in the past but has never been lower than 100,000.   RECOMMENDATIONS:  I think it is reasonable to proceed with a blood   transfusion in view of her underlying cardiac history.   When gastritis subsides, I would give her oral iron replacement to cover  recent surgical losses.  Serum iron is slightly decreased.  Ferritin is  normal but this is likely an acute phase reactant.   I would consider holding or stopping her proton pump inhibitor  just in  case this is exerting some negative effect on her blood production and  substitute with liquid antacid for her gastritis.   I will check some parameters to exclude hemolysis but I doubt this both  clinically and from review of the peripheral blood.   I will be happy to follow her after discharge if her blood counts  remained low.  I discussed this with her and her daughter.      Genene Churn. Cyndie Chime, M.D.  Electronically Signed     JMG/MEDQ  D:  08/22/2008  T:  08/22/2008  Job:  [id number]   cc:   Willow Ora, MD

## 2010-05-23 NOTE — Op Note (Signed)
NAMEKATIANNE, BARRE               ACCOUNT NO.:  [id number]   MEDICAL RECORD NO.:  [id number]          PATIENT TYPE:  INP   LOCATION:  4735                         FACILITY:  [facility]   PHYSICIAN:  Toni Arthurs, MD        DATE OF BIRTH:  1928-12-27   DATE OF PROCEDURE:  08/15/2008  DATE OF DISCHARGE:                               OPERATIVE REPORT   PREOPERATIVE DIAGNOSIS:  Right bimalleolar ankle fracture.   POSTOPERATIVE DIAGNOSIS:  Right bimalleolar ankle fracture.   PROCEDURE:  Open reduction and internal fixation of right bimalleolar  ankle fracture.   SURGEON:  Toni Arthurs, MD   ANESTHESIA:  General IV fluids.  See anesthesia record.   ESTIMATED BLOOD LOSS:  10 mL.   TOURNIQUET TIME:  2 hours at 300 mmHg.   COMPLICATIONS:  None apparent.   DRAINS:  Small Hemovac x2.   DISPOSITION:  Extubated, awake, and stable to recovery.   INDICATIONS FOR PROCEDURE:  The patient is a 77 year old female who  turned her ankle while trying to turn around in the bathroom.  She  presented to the emergency room where x-rays revealed a bimalleolar  ankle fracture that was displaced.  She presents now for operative  treatment of this injury.  She understands the risks and benefits of  this procedure and elects to proceed.   PROCEDURE IN DETAIL:  After preoperative consent was obtained a correct  operative site was identified, the patient was brought was the operating  room and placed supine on the operating table.  General anesthesia was  induced.  Preoperative antibiotics were administered.  Surgical time-out  was taken.  The right lower extremity was prepped and draped in standard  sterile fashion with tourniquet around the proximal thigh.  Medial and  lateral incisions were marked on the skin in longitudinal fashion.  The  extremity was exsanguinated and the patient tourniquet was inflated to  300 mmHg.  The medial incision was made and dissection was carried down  to the fracture site.   The fracture site was exposed and the periosteum  was excised allowing visualization of the fracture line.  The anterior  ankle joint could be seen and there was no significant damage to the  talar cartilage.  The medial malleolar fracture was noted to have severe  comminution at the metaphyseal area, and the patient's bone quality was  noted to be extremely poor.  A 1.6-mm guidepin was inserted from the tip  of the medial malleolus up into the metaphyseal bone after reducing the  fracture.  Attention was then turned to the lateral aspect of the ankle  where the previously marked incision was made.  A blunt dissection was  again carried down to the fracture site.  The fracture was exposed and  reduced.  A DePuy anatomic fibular plate with 4 holes proximally was  selected and applied to the lateral aspect of the fibula.  AP lateral  and oblique views of the ankle showed appropriate reduction of the  fractures and appropriate length of the plate.  The plate was then  secured to  the distal fibula with the pin and clamped to the proximal  fibula with a lobster-claw.  The appropriate position was again verified  with fluoroscopy.  The most proximal of the distal locking screws was  selected and drilled a nonlocking bicortical 3.5 mm fully threaded screw  was inserted to pull the plate down flush to the bone.  The oblong hole  on the proximal end of the plate was then selected and drilled for a  bicortical screw.  A 3.5 mm fully threaded screw was noted to have poor  purchase.  This was switched up to a 4-mm fully threaded cancellous  screw that was inserted in bicortical fashion and was noted to have a an  expectable amount of purchase.  The remaining holes in the distal end of  the plate were then drilled and filled with locking 3.5 mm fully  threaded screws and locking 4.0 fully threaded cortical screws.  The  remaining 2 holes in the proximal end of the plate were filled with 3.5  mm fully  threaded bicortical screws.  The remaining nonlocking hole was  also filled with a 3.5 mm fully threaded bicortical screw.  The pin was  removed.  X-rays again showed appropriate reduction of the fibular  fracture and appropriate position and length of all hardware.  Attention  was then returned to the medial malleolus.  The previously inserted  guidewire was augmented with a second parallel guidewire just posterior  to the first.  The comminuted area proximally was noted to be somewhat  unstable, so a transverse pin was inserted through this area and was  used to secure the fracture in its reduced position.  Both distal screws  were drilled and filled with fully threaded 4.0 mm cancellous screws  that were 50 and 60 mm long.  At this point, the transverse pin was  removed and it was felt that the metaphyseal bone was still somewhat  unstable.  A 2 hole one-third tubular plate was selected and applied in  buttress fashion over the fracture line.  It was secured to the distal  tibial bone with bicortical 3.5 mm fully threaded screws.  AP mortise  and lateral views of the ankle then showed appropriate reduction of both  fractures and appropriate position and length of all hardware.  Both  wounds were then irrigated copiously.  Small Hemovac drains were placed  in the deep layers of each wound.  Prior to placing the drain, the  periosteum was closed over hardware medially and laterally.  The  subcutaneous tissue was then approximated with inverted simple sutures  of 3-0 Vicryl.  A running 3-0 Prolene suture was used to close both  incisions.  Sterile dressings were applied followed by a well-padded  short leg splint.  The patient was then awakened from anesthesia and  transported to recovery room in stable condition.      Toni Arthurs, MD  Electronically Signed     JH/MEDQ  D:  08/18/2008  T:  08/19/2008  Job:  [id number]

## 2010-05-23 NOTE — H&P (Signed)
[name], [name]               ACCOUNT NO.:  [id number]   MEDICAL RECORD NO.:  [id number]          PATIENT TYPE:  INP   LOCATION:  1826                         FACILITY:  [facility]   PHYSICIAN:  Barbette Hair. Artist Pais, DO      DATE OF BIRTH:  [birth year]   DATE OF ADMISSION:  05/19/2006  DATE OF DISCHARGE:                              HISTORY & PHYSICAL   PRIMARY CARE PHYSICIAN:  Karie Schwalbe, MD   CHIEF COMPLAINT:  Gross hematuria.   HISTORY OF PRESENT ILLNESS:  The patient is a 77 year old white female  with past medical history of coronary artery disease, atrial  fibrillation on Coumadin who presents with gross hematuria.  Her  symptoms started on Saturday morning when she noticed bright red blood  after urinating.  She normally wears pads due to urinary incontinence.  Later that day and into Saturday evening, she did not notice any further  bleeding.  She awoke this morning with recurrence of significant gross  hematuria.  She currently lives with her daughter who took her to  [facility].  They recommended possible hospitalization.  She denies any associated symptoms.  No report of weakness, chest pain  or shortness of breath.   She denies any previous episodes of hematuria, and she was a smoker in  the past.  She quit tobacco in 2003, but has greater than 20 pack year  history.  She was recently evaluated by Dr. Leone Payor of [facility] due to  dysphagia and intermittent looses stools.  A barium swallow was planned  for Monday with possible follow up EGD and colonoscopy.   PAST MEDICAL HISTORY:  1. Atrial fibrillation on Coumadin.  2. History of bradycardia status post pacer.  3. History of coronary artery disease.  4. History of MI.  5. Status post PTCA 2003 with stent by Dr. Tresa Endo, her cardiologist.  6. Depression/anxiety.  7. Memory loss/dementia.  8. History of peripheral neuropathy.  9. Osteoporosis.  10.Bilateral cataract surgery.   SOCIAL HISTORY:   The patient is widowed, lives with her daughter  Marcelino Duster.  There is no alcohol or tobacco use as noted above.  She has  two other sons.   FAMILY HISTORY:  Mother had emphysema.  Brother and sister with  alcoholism.  Father had Hodgkin's disease.  Another brother with  pancreatic cancer.   CURRENT MEDICATIONS:  1. Lasix 80 mg once daily.  2. Lexapro 10 mg once daily.  3. Aricept 5 mg once daily.  4. Norvasc 5 mg once daily.  5. Vytorin 10/40 once daily.  6. Coumadin.  7. Toprol XL 50 mg once daily.  8. Diovan 160 mg once daily.  9. Imodium p.r.n.  10.Pepcid A.C. p.r.n.   ALLERGIES:  None known.   REVIEW OF SYSTEMS:  Positive urinary frequency but no dysuria.  No early  satiety, but does have abdominal distention with urination, some fecal  incontinence.  All other systems negative.   LABORATORY DATA:  CBC showed WBC 4.4, H&H 12.7 and 37.3, platelets 171.  Comprehensive metabolic profile notable for sodium 139, potassium 3.9,  BUN 25, creatinine 0.8, blood sugar 93.  LFT's were normal.  Venous pH  was 7.365, PCO2 46.6 and bicarbonate 26.6.  UA showed gross hematuria,  positive nitrites and positive leukocyte esterase.   PHYSICAL EXAMINATION:  VITAL SIGNS:  Temperature 96.6, blood pressure  144/66, pulse 65, respirations 20, 97% on room air.  GENERAL:  The patient is a very pleasant 77 year old white female,  awake, alert, no apparent distress.  HEENT:  Normocephalic, atraumatic.  Pupils equal and reactive to light  bilaterally.  Evidence of previous bilateral iridectomy.  Normal  conjunctivae.  NECK:  Supple.  No adenopathy.  No carotid bruit.  No thyromegaly.  CHEST:  Normal respiratory effort.  Clear to auscultation bilaterally.  No rhonchi, rales or wheezing.  CARDIOVASCULAR:  Regular rate and rhythm.  No significant murmurs, rubs  or gallops appreciated.  ABDOMEN:  Protuberant, soft, nontender, positive bowel sounds.  The  patient did not have any flank or CVA  tenderness.  EXTREMITIES:  No cyanosis, clubbing or edema.  The patient had palpable  pedal dorsalis pulses.  SKIN:  Warm and dry.  NEUROLOGICAL:  Cranial nerves 2-12 was intact.  She was nonfocal.   Examination of her Foley catheter revealed a fair amount of  approximately 200-300 cc of gross, bloody fluid in her Foley bag.   IMPRESSION/RECOMMENDATIONS:  1. Gross hematuria.  2. Urinary tract infection.  3. Coagulopathy.  4. Atrial fibrillation.  5. Coronary artery disease.  6. Hypertension.  7. Hyperlipidemia.  8. Anxiety/depression.  9. History of tobacco abuse.   RECOMMENDATIONS:  The patient will be admitted for IV fluids.  We will  hold her Coumadin and start IV Cefazolin for UTI.  Her hematuria may be  due to cystitis.  However, due to her smoking history, we need to rule  out bladder lesion.  We will consult urology.   Due to her vague abdominal complaints, she is to follow up with Dr.  Leone Payor as long as her creatinine stay stable.  Consider CT of the  abdomen and pelvis.      Barbette Hair. Artist Pais, DO  Electronically Signed     RDY/MEDQ  D:  05/19/2006  T:  05/20/2006  Job:  [id number]   cc:   Karie Schwalbe, MD

## 2010-05-23 NOTE — Assessment & Plan Note (Signed)
[facility]                         GASTROENTEROLOGY OFFICE NOTE   NAME:[name], [name]                      MRN:          [id number]  DATE:05/14/2006                            DOB:          Nov 05, 1928    REFERRING PHYSICIAN:  Karie Schwalbe, MD   GASTROENTEROLOGY CONSULTATION:   REASON FOR CONSULTATION:  Change in bowel habits with diarrhea.   ASSESSMENT:  1. This is a 77 year old white woman who has had some chronic urgent      defecation problems, but they are much worse.  She is having      incontinence and loose stools regardless of what she eats.  Imodium      does help it, and she uses this as needed.  Differential diagnosis      include irritable bowel syndrome, inflammatory bowel disease,      colorectal neoplasia/cancer.  2. Chronic intermittent dysphagia.  It has not been helped by Nexium.      Question dysmotility, question esophageal stricture or ring.  3. Chronic Coumadin therapy for atrial fibrillation, followed by Dr.      Daphene Jaeger.   RECOMMENDATIONS AND PLAN:  1. She is going to need a colonoscopy.  Risks, benefits and      indications are explained.  2. An upper GI endoscopy with possible esophageal dilation is      considered, but I want to do a barium swallow with tablet first to      try to learn more about her anatomy to try to determine how to      approach this.  3. Try Imodium every day, backing off if it causes constipation.  4. We will contact the patient and her daughter once the results of      the barium swallow and Dr. Landry Dyke recommendation regarding holding      Coumadin 5 days prior to the procedure return.  This should be      okay, but there is some increased risk of stroke, which raises the      risk of her procedures, when she comes off Coumadin; however, I      think it would be optimal to perform these procedures off of      Coumadin.   HISTORY:  A [age]-year-old white woman with problems  as above.  She has tried to determine what foods bother her; she says just a leaf  of lettuce will make her go to the bathroom with urgent crampy  defecation and in the setting of a worsening peripheral neuropathy, she  is having more difficulty reaching the bathroom and having incontinence.  If she has a couple of bad days, she will use some Imodium and she will  be okay for a couple of days it sounds like.  She also has some urinary  incontinence for the past 5 years.  In addition, she chokes very easily  for years and what she describes as a suprasternal impact dysphagia and  regurgitation of things like chicken.  Her weight has actually gone up;  she is not losing weight.  She is not  describing rectal bleeding.  GI  review of systems otherwise as above or as reflected in my medical  history form.   CURRENT MEDICATIONS:  1. Aricept 5 mg daily.  2. Norvasc 5 mg daily.  3. Lexapro 10 mg daily.  4. Toprol-XL 50 mg daily.  5. Coumadin as directed.  6. Furosemide 80 mg daily.  7. Diovan 160 mg daily.  8. Vytorin 10/40 daily.  9. Tylenol Extra Strength at bedtime.  10.Imodium p.r.n.  11.Tums p.r.n.  12.Pepcid AC p.r.n.   DRUG ALLERGIES:  None known.   PAST MEDICAL HISTORY:  1. Coronary artery disease with history of myocardial infarction.  2. Atrial fibrillation.  3. Congestive heart failure with ejection fraction of 35%, but then      improvement.  4. Peripheral neuropathy.  5. Alzheimer's dementia, on medication for about 3 years.  6. Gastroesophageal reflux disease (belching).  7. Osteoporosis.  8. Pacemaker.  9. It sounds like she has had a percutaneous coronary intervention in      2003 as well with stent (Dr. Daphene Jaeger).  10.Osteoarthritis.  11.Cataract surgery x2.   FAMILY HISTORY:  Mother had emphysema.  Brother and sisters have  alcoholism.  Father had Hodgkin's disease and a sister had Hodgkin's  disease.  A brother had carcinoma of the pancreas.   SOCIAL  HISTORY:  She is widowed with 2 sons and 1 daughter.  She lives  with her daughter and is here with her today.  She quit smoking in 2003.  No alcohol use.   REVIEW OF SYSTEMS:  As above.  A lot of back pain, some cough.  She uses  a walker to ambulate.  She has pedal edema.  She feels hoarse at times.  Note that she was on Nexium for a while and it did not seem to help the  belching or the dysphagia and it was costly, so it was discontinued and  there really seems to be no change in her symptom complex with that.  Review of systems otherwise negative or as reflected in my medial  history form.   PHYSICAL EXAMINATION:  A well-developed elderly white woman.  Height 5 feet 7 inches, weight 175 pounds.  Blood pressure 110/70, pulse  72 and regular today.  HEENT: The eyes are anicteric.  ENT:  Normal mouth and posterior  pharynx, with dentures.  NECK:  Supple without thyromegaly or mass.  CHEST:  Clear.  CARDIAC:  S1 and S2.  I hear no rubs, murmurs or gallops and appears to  be in a normal rhythm today.  ABDOMEN:  Soft and nontender without organomegaly or mass.  RECTAL:  Exam is deferred.  LYMPHATICS:  No neck or supraclavicular lymph nodes palpated.  EXTREMITIES:  Lower extremities show 1+ lower extremity edema.  SKIN:  Warm and dry without acute rash.  NEUROLOGIC/PSYCHIATRIC:  She is awake and alert and oriented to person  and place.  She thought it was 2007 and not 2008.   LABORATORY DATA:  From February 26, 2006 shows a glucose of 102,  otherwise normal electrolytes with albumin 3.9.  Her TSH was 0.07, but  her free T4 was 1.5 with normal thyroid function.  Pulmonary function  tests showed moderately severe restrictive lung disease.   I have also reviewed Dr. Karle Starch problem list and office notes from  April 29 2006.  He listed anxiety as one of her problems as well.  She  was on Lexapro and still is.    Baldo Ash  Sena Slate, MD,FACG  Electronically Signed    CEG/MedQ  DD:  05/14/2006  DT: 05/14/2006  Job #: [id number]   cc:   Karie Schwalbe, MD  Nicki Guadalajara, M.D.

## 2010-05-23 NOTE — Assessment & Plan Note (Signed)
[facility]                                 ON-CALL NOTE   NAME:INGLECici, [name]                        MRN:          [id number]  DATE:05/19/2006                            DOB:          [birth year]    Patient of Dr. Alphonsus Sias.   [name] calls from [phone] at 3:44 p.m. on May 19, 2006  stating her mom had blood in her urine and was not feeling well today.  I recommended she take her to an Urgent Care or emergency room to be  evaluated.     [name], DO  Electronically Signed    Shawnie Dapper  DD: 05/19/2006  DT: 05/20/2006  Job #: [id number]   cc:   Karie Schwalbe, MD

## 2010-05-23 NOTE — Op Note (Signed)
NAMEKATELEE, SCHUPP               ACCOUNT NO.:  [id number]   MEDICAL RECORD NO.:  [id number]          PATIENT TYPE:  INP   LOCATION:  4703                         FACILITY:  [facility]   PHYSICIAN:  Excell Seltzer. Annabell Howells, M.D.    DATE OF BIRTH:  12-15-1928   DATE OF PROCEDURE:  05/21/2006  DATE OF DISCHARGE:  05/23/2006                               OPERATIVE REPORT   PROCEDURE:  Flexible cystoscopy.   PREOPERATIVE DIAGNOSES:  1. Hematuria.  2. Left renal mass.   POSTOPERATIVE DIAGNOSES:  1. Hematuria.  2. Left renal mass.   SURGEON:  Excell Seltzer. Annabell Howells, M.D.   ANESTHESIA:  Local.   INDICATIONS:  Ms. Westrich as a 77 year old white female with hematuria  that is likely from a large left upper pole renal mass, but it was felt  that bladder tumors needed to be ruled out.   FINDINGS AT PROCEDURE:  The patient was placed in the supine position  with her legs froglegged.  She was prepped with Betadine solution, and a  16-French flexible cystoscope was inserted per urethra.  Examination  revealed a normal urethra.  The bladder wall was smooth and pale, with  the exception of some irritation from her Foley catheter.  No tumors  were seen.  No stones were seen.  The ureteral orifices were  unremarkable.  The scope was removed.  There were no complications  during the procedure.      Excell Seltzer. Annabell Howells, M.D.  Electronically Signed     JJW/MEDQ  D:  08/01/2006  T:  08/02/2006  Job:  [id number]   cc:   Barbette Hair. Artist Pais, DO

## 2010-05-23 NOTE — Assessment & Plan Note (Signed)
[facility]                                 ON-CALL NOTE   NAME:[name], [name]                        MRN:          [id number]  DATE:05/23/2006                            DOB:          [birth year]    TIME OF INTERACTION:  At 6:36 p.m.   PHONE NUMBER:  [phone]   The caller was Alferd Patee, the daughter.   OBJECTIVE:  The caller's mother [name] was discharged from the  hospital earlier this afternoon. She was there four days at [facility]. She  was admitted for hematuria and found to have renal cell carcinoma and is  scheduled for surgery in June. She also has atrial fibrillation and was  on Coumadin until Saturday night at which time it was stopped and she is  now on an aspirin a day. The patient came home and now when she walks to  the bathroom, which is next door to her room, she ends up panting and  breaking out in a sweat from the exertion. As far as the daughter knows,  there was no problem with breathing in the hospital. As I said, she has  atrial fibrillation and has been on Coumadin until Saturday. When she is  not walking around and exerting herself, she breathes reasonably  normally.   ASSESSMENT:  Shortness of breath with exertion.   PLAN:  Possibly combination of debility and anemia from the bleeding may  make her short of breath at this time. If it worsens or if it continues,  I would have the patient re-evaluate it at the hospital. If the patient  gets acutely worse, would call 911 and have the patient transported to  the hospital. The patient's daughter may be able to call tomorrow to  find out more information on discharge and the condition of the patient  as far as anemia and shortness of breath is concerned while she was in  the hospital.   PRIMARY CARE PHYSICIAN:  Karie Schwalbe, MD at home office of [facility]     Arta Silence, MD  Electronically Signed    RNS/MedQ  DD: 05/23/2006  DT: 05/23/2006  Job #:  [id number]   cc:   Karie Schwalbe, MD

## 2010-05-23 NOTE — Op Note (Signed)
NAMEBRIANNY, [name]               ACCOUNT NO.:  [id number]   MEDICAL RECORD NO.:  [id number]          PATIENT TYPE:  AMB   LOCATION:  DAY                          FACILITY:  [facility]   PHYSICIAN:  Heloise Purpura, MD      DATE OF BIRTH:  07/10/1928   DATE OF PROCEDURE:  08/08/2006  DATE OF DISCHARGE:                               OPERATIVE REPORT   PREOPERATIVE DIAGNOSIS:  Bladder lesion.   POSTOPERATIVE DIAGNOSIS:  Bladder lesion.   PROCEDURE:  1. Cystoscopy.  2. Bladder biopsy.   SURGEON:  Heloise Purpura, M.D.   ANESTHESIA:  General.   COMPLICATIONS:  None.   SPECIMENS:  Bladder biopsy.   DISPOSITION:  Specimen to pathology.   INDICATIONS:  [name] is a 77 year old lady with a history of  hematuria and a left renal cell carcinoma who is status post a left  laparoscopic radical nephrectomy.  As part of her hematuria evaluation,  she did undergo cystoscopy, which demonstrated a submucosal nodular  lesion towards the dome of the bladder.  She presents today for further  evaluation and biopsy of this lesion.  Potential risks, benefits and  alternative options were discussed with the patient, and informed  consent was obtained.   DESCRIPTION OF PROCEDURE:  The patient was taken to the operating room,  and a laryngeal mask anesthetic was administered.  She was given  preoperative antibiotics, placed in the dorsal lithotomy position, and  prepped and draped in the usual sterile fashion.  Next, a preoperative  time-out was performed.  Cystourethroscopy was then performed with a 30  degree lens which allowed systematic inspection of the bladder.  The  ureteral orifices were noted in normal anatomic position.  The right  ureteral orifice was noted to be effluxing clear urine.  The bladder was  then examined systematically, and there was noted to be a submucosal  nodular lesion just to the left of the dome of the bladder.  This was  biopsied with the cold-cup biopsy forceps, and a  nodular lesion was  removed.  This was able to be completely removed with the biopsy forceps  and the bladder resection site was then fulgurated with the  Bugbee electrode.  The patient's bladder was the emptied and  reinspected, and hemostasis was insured.  The bladder was then emptied,  and the procedure was ended.  The patient tolerated the procedure well  without complications.  She was able to be awakened and transferred to  the recovery unit in satisfactory condition.      Heloise Purpura, MD  Electronically Signed     LB/MEDQ  D:  08/08/2006  T:  08/08/2006  Job:  [id number]

## 2010-05-23 NOTE — Discharge Summary (Signed)
[name], [name]               ACCOUNT NO.:  [id number]   MEDICAL RECORD NO.:  [id number]          PATIENT TYPE:  INP   LOCATION:  4735                         FACILITY:  [facility]   PHYSICIAN:  Lonia Blood, M.D.       DATE OF BIRTH:  July 20, 1928   DATE OF ADMISSION:  08/15/2008  DATE OF DISCHARGE:  08/23/2008                               DISCHARGE SUMMARY   ADDENDUM   The patient will be provided with a prescription for OxyContin 10 mg  p.o. q.12 h., dispensed 10 with 0 refills, to be sent with her to the  skilled nursing facility.      Sandford Craze, NP      Lonia Blood, M.D.  Electronically Signed    MO/MEDQ  D:  08/23/2008  T:  08/24/2008  Job:  [id number]

## 2010-05-23 NOTE — Consult Note (Signed)
NAMEAQUILLA, SHAMBLEY               ACCOUNT NO.:  [id number]   MEDICAL RECORD NO.:  [id number]          PATIENT TYPE:  INP   LOCATION:  4735                         FACILITY:  [facility]   PHYSICIAN:  Anselmo Rod, M.D.  DATE OF BIRTH:  1928-06-16   DATE OF CONSULTATION:  08/22/2008  DATE OF DISCHARGE:                                 CONSULTATION   REASON FOR CONSULTATION:  Gradual drop in hemoglobin.   PHYSICIAN REQUESTING CONSULT:  Willow Ora, MD.   ASSESSMENT:  1. Anemia with leukopenia and thrombocytopenia, guaiac negative stool      in 77 year old white female with multiple medical problems.  The      patient also has a history of pancytopenia documented in 2007.      Rule out recurrent pancytopenia.  Question bone marrow depression.  2. Constipation secondary to narcotics post tibia fibula fracture.  3. Diarrhea secondary to Senokot.  4. Gastroesophageal reflux disease on Prilosec on a p.r.n. basis.  5. Status post tibia fibula fracture after a fall; open reduction      internal fixation on August 18, 2008.  6. History of coronary artery disease status post PTCA in 2003 with a      stent placed by Dr. Nicki Guadalajara.  History of congestive heart      failure, myocardial infarction,  hypotension, atrial fibrillation      on Coumadin.  The patient has history of a transvenous pacemaker      placed in 2003.  Memory loss and dementia on Aricept.  7. Peripheral neuropathy.  8. Osteoporosis.  9. History of anxiety and depression.  10.Status post left laparoscopic nephrectomy for renal cell carcinoma.  11.History of bilateral cataract extraction and intraocular lens      implant.  12.History of tobacco use until 2003 with chronic bronchitis and mild      chronic obstructive pulmonary disease.  13.Family history of Hodgkin's disease in father and sister.  14.Family history of pancreatic cancer in a brother.  15.Patient is hard of hearing.   RECOMMENDATIONS:  1. Discontinue stool  studies and isolation for now.  2. Hematology consult.  3. Hold Senokot for now.  4. Monitor serial complete blood counts.   DISCUSSION:  Ms. Brooklin Rieger is a 77 year old white female with  multiple medical problems listed above who was admitted to the hospital  on August 15, 2008 after she fell in the bathroom and sustained a tib-fib  fracture on the right side.  She had an open reduction internal fixation  on August 18, 2008 done by Dr. Toni Arthurs and except for some  constipation secondary to narcotics given to her postoperatively, she  has done fine from a GI standpoint.  According to her daughter who was  in the room with her, she had a couple of episodes of emesis yesterday.  She has had some mild epigastric discomfort but has been on Protonix  since admission.  She denies history of melena or hematochezia.  She has  had some loose stools after taking Senokot for a few days.  She was  noticed to have  a gradual drop in her hemoglobin.  Hemoglobin on  admission was 12.7 gm/dL.  It has dropped to 8 gm/dL with a white count  of 5.4 on admission down to 3.2 today and platelets 134K on admission  down to 120 today.  The  patient had guaiac studies done today by Dr.  Drue Novel and stool was negative for occult blood.  She denies a history of  any dysphagia or odynophagia.  She has a long-standing history of reflux  and has had an ulcer in the remote past as per her daughter treated  medically.  She has been on Nexium for years but was taken off the  Nexium and was started on Prilosec a few years ago.  She has had a  colonoscopy ad an EGD with dilatation by  Dr. Stan Head in 2008 prior  to her nephrectomy, but a copy of those records is not available to me  at this time.   ALLERGIES:  No known drug allergies.   MEDICATIONS IN THE HOSPITAL:  Are albuterol, Norvasc, Colace, Aricept,  Lovenox, Lexapro, Lasix, Benicar, Oxycodone, Protonix, senna, Coumadin  and Tylenol, Gaviscon, morphine  sulfate, Zofran and Ambien.   SOCIAL HISTORY:  She is widowed.  She has a daughter who she lives with  and two sons.  She used to smoke until 2003 when she stopped smoking.  She denies the use of alcohol or illicit drug use.   FAMILY HISTORY:  Her father and sister had Hodgkin's disease.  Her  brother had pancreatic cancer.  There is a history of COPD in her  mother.  Two of her brothers had heart disease and alcoholism.   REVIEW OF SYSTEMS:  1. Epigastric pain.  2. Loose stools today following bouts of constipation postoperatively.  3. Some nausea and vomiting yesterday with no problems in that regard      today.  4. No history of melena, hematochezia, no history of shortness of      breath, chest pain.  No genitourinary complaints.   GENERAL PHYSICAL EXAMINATION:  The patient is a pleasant, cooperative  older white female who is somewhat hard of hearing lying in bed in no  acute distress.  Temperature 98.1.  blood pressure 106/50, pulse of 59 per minute,  respiratory rate 20.  HEENT:  Examination reveals atraumatic, normocephalic head.  NECK:  Supple.  CHEST:  Clear to auscultation.  S1, S2 irregular.  ABDOMEN:  Soft, nondistended with mild epigastric tenderness on  palpation with no guarding, rebound, rigidity.  No hepatosplenomegaly.  RECTAL:  Examination deferred.   LABORATORY EVALUATION:  Revealed a white count of 3.2 with a hemoglobin  of 8 gm/dL and platelet count of 409W.  Iron was low at 37 with sodium  of 111.  Folate was normal at 10.4 and B12 level was normal at 295.  INR  was 1.2 yesterday.  PTT is 51 today.  CMET on admission revealed albumin  of 4.  Essentially normal LFTs.  Calcium of 9.3.   PLAN:  As above. I have discussed the plans with Dr. Willow Ora.  Further  recommendation to be made in followup.      Anselmo Rod, M.D.  Electronically Signed     JNM/MEDQ  D:  08/22/2008  T:  08/22/2008  Job:  [id number]   cc:   Iva Boop, MD,FACG  Toni Arthurs, MD  Nicki Guadalajara, M.D.  Karie Schwalbe, MD

## 2010-05-23 NOTE — Assessment & Plan Note (Signed)
[facility]                                 ON-CALL NOTE   NAME:INGLELibni, Fusaro                        MRN:          [id number]  DATE:06/03/2006                            DOB:          [birth year]    TIME:  5:41 p.m.   ON CALL NOTE:  Phone call comes from her daughter, Barnet Glasgow, at  [phone], one of my patient's. She has had some blood in her stool and  some mucous in the last day or so. She has a recent diagnosis of renal  cell carcinoma and is awaiting surgery. She has been on iron pills and a  lot of potassium. She has been able to  eat. No major abdominal pain but  she has had her hemorrhoids out and that has really been bothering her.   PLAN:  I am concerned about this. I think it is reasonable to try some  Cortizone cream on the inflamed hemorrhoids but I did not want to  prescribe anything over the phone unless I had taken a look at her. Her  daughter is going to watch her through tomorrow and then if she is still  concerned, will call me and will arrange for an office visit tomorrow.     Karie Schwalbe, MD  Electronically Signed    RIL/MedQ  DD: 06/03/2006  DT: 06/03/2006  Job #: [id number]

## 2010-05-23 NOTE — Discharge Summary (Signed)
NAMELENAH, MESSENGER               ACCOUNT NO.:  [id number]   MEDICAL RECORD NO.:  [id number]          PATIENT TYPE:  INP   LOCATION:  4735                         FACILITY:  [facility]   PHYSICIAN:  Lonia Blood, M.D.       DATE OF BIRTH:  August 25, 1928   DATE OF ADMISSION:  08/15/2008  DATE OF DISCHARGE:  08/23/2008                               DISCHARGE SUMMARY   PRIMARY CARE PHYSICIAN:  Karie Schwalbe, MD   DISCHARGE DIAGNOSES:  1. Status post fall with right tibial and fibular fracture status post      open reduction internal fixation.  2. Postoperative anemia, question acute blood loss anemia due to      operative procedure, No overt or occult gastrointestinal bleeding      observed.  3. Postoperative thrombocytopenia, improving.  4. Chronic atrial fibrillation on Coumadin.  5. Coronary artery disease status post stenting.  6. Gastroesophageal reflux disease.  7. Mild cognitive deficit/dementia.  8. History of left renal cell carcinoma status post left nephrectomy.  9. Depression and anxiety.  10.Hypertension.  11.Ischemic cardiomyopathy with ejection fraction of 30-40%, chronic      and stable without exacerbation  12.Diarrhea secondary to Senokot, resolved.  13.Peripheral neuropathy.  14.COPD.   DISCHARGE MEDICATIONS:  1. Norvasc 5 mg daily.  2. Lasix 20 mg daily.  3. Panic are 20 mg twice a day.  4. Lexapro 10 mg daily.  5. Metoprolol 12.5 mg daily.  6. Aricept 5 mg at bedtime.  7. OxyContin 10 mg twice a day, to be discontinued if the patient      display signs of confusion.  8. Lovenox 80 mg subcu every 12 hours until Coumadin is therapeutic.  9. Coumadin 6 mg daily to be further dosed by the pharmacist at the      skilled nursing facility and PT/INR to be checked on a daily basis.  10.Pepcid 20 mg daily.  11.Albuterol nebulizers 2.5 mg every 6 hours as needed.  12.Vytorin 10/40 mg daily.   CONDITION ON DISCHARGE:  Ms. Wiegman was discharged to skilled nursing  facility for short-term rehabilitation.  She will need a follow-up  arranged with the orthopedic service of Dr. Laverta Baltimore office, phone  number is [phone].  The nursing home must call for appointment within 1  week after discharge.   PROCEDURE:  This admission, the patient underwent an open reduction  internal fixation of her ankle fracture on August 15, 2008.   CONSULTATION:  This admission, the patient was seen in consultation by  Cardiology, Hematology Oncology and Gastroenterology.   HISTORY AND PHYSICAL:  Refer to dictated H&P done by Dr. Allena Katz on August 15, 2008.   HOSPITAL COURSE:  Ms. Appelhans, a 77 year old woman with multiple medical  problems, came in after a fall with a right tibial fibular fracture.  She was seen emergently by Cardiology as well as Orthopedics, and she  received fresh frozen plasma and was taken to the operating room for  open reduction internal fixation.  The procedure was done on August 18, 2008, and the patient tolerated procedure well  without any major  bleeding noticed.  Postoperatively, the patient had a drop in her  hemoglobin and a drop in her platelet count which was attributed to the  acute stress of the surgery.  The patient did not display any overt or  occult gastrointestinal bleeding.  She received transfusion with 2 units  of packed red blood cells and her discharge hemoglobin is at 10.4 with  discharge platelet count of 145,000.  Ms. Bazile is to continue full dose  anticoagulation for atrial fibrillation and rehabilitation to a skilled  nursing facility, and should she continue to have some mild  pancytopenia, she can follow up in the Hematology/Oncology clinic.      Lonia Blood, M.D.  Electronically Signed     SL/MEDQ  D:  08/23/2008  T:  08/23/2008  Job:  [id number]   cc:   Toni Arthurs, MD  Karie Schwalbe, MD

## 2010-05-23 NOTE — H&P (Signed)
[name], [name] NO.:  [id number]   MEDICAL RECORD NO.:  [id number]          PATIENT TYPE:  EMS   LOCATION:  MAJO                         FACILITY:  [facility]   PHYSICIAN:  Donalynn Furlong, MD      DATE OF BIRTH:  [birth year]   DATE OF ADMISSION:  08/15/2008  DATE OF DISCHARGE:                              HISTORY & PHYSICAL   PRIMARY CARE Tyde Lamison:  Dr. Tillman Abide with [facility].   CHIEF COMPLAINT:  Fall and a right tibial fibular fracture.   HISTORY OF PRESENT ILLNESS:  Ms. [name] is a 77 year old Caucasian female  who lives in [place] by herself.  She presented to [facility] ED  tonight after sustaining a fall in the bathroom which was a mechanical  fall.  She did not lose consciousness.  She denied any preceding or  symptoms after the fall.  She has been stable and she denies any other  symptoms with the current fall at all.  She has a history of coronary  artery disease and atrial fibrillation with pacemaker, gastroesophageal  reflux disease, hyperlipidemia, osteoporosis.  She denied any recent  cardiac, pulmonary, GI symptoms.   PAST MEDICAL HISTORY:  Urine incontinence, anxiety, depression, AFib on  Coumadin, coronary artery disease, gastroesophageal reflux disease,  hyperlipidemia, osteoporosis, CHF, neuropathy, hypertension, dementia,  depression, benign tumor of urinary bladder, left renal cell cancer  clear cell type status post nephrectomy in 2008, history of tobacco use,  history of aneurysm repair, history of pacemaker placement, history of  bilateral cataract surgery.   PAST SURGICAL HISTORY:  As per past medical history.   REVIEW OF SYSTEMS:  Positive as per HPI, otherwise negative review of  systems done for 14 systems.   FAMILY HISTORY:  The patient has a family history of COPD in her mother.  Her father died of Hodgkin's disease at the age of 53 also he was  alcoholic and had a stroke.  Two brothers one of whom is deceased,  other  had a four-vessel coronary artery bypass graft with a heart attack, both  have had alcoholism.  Sister is alive and well.   SOCIAL HISTORY:  She is widowed in 1996, lives with daughter, has 2 sons  and one daughter.  She used to smoke prior to 2003.  No recent alcohol  use.   HOME MEDICATION LIST:  1. Aricept.  2. Lexapro.  3. Vytorin.  4. Norvasc.  5. Warfarin.  6. Lasix.  7. Diovan.  8. Vesicare.  9. Toprol XL.  Doses of the medication not known at this time.   ALLERGIES:  None.   PHYSICAL EXAM:  VITAL SIGNS:  Blood pressure 154/61, pulse 88,  respirations 16, temperature 97.5, oxygen saturation 98% on room air.  GENERAL:  Alert, oriented x3 lying in bed without any acute distress.  CARDIOVASCULAR:  Sinus regular.  No murmur or gallop.  LUNGS:  Clear to auscultation bilaterally.  No wheezing, rhonchi,  crackles.  ABDOMEN:  Nontender, nondistended.  Bowel sounds present.  EXTREMITIES:  No clubbing, cyanosis or edema.  Pulse palpable in all  four extremities.  Right lower extremity could not be moved due to the  pain and recent fracture.  HEAD:  Normocephalic nontraumatic.  EYES:  Pupils reactive to light and accommodation.  Extraocular muscles  intact.  ORAL CAVITY:  Mucous membranes dry.  No thrush noted.  NECK:  No thyromegaly or JVD and no lymphadenopathy palpated in the  neck.  SKIN:  No rash or bruits.  NEUROLOGICAL:  Exam could not be done as the patient is bed bound but  her strength is preserved in both upper extremities.  Cranial nerves are  intact.  Sensation and reflexes are intact in both upper extremities.   LAB WORK:  CBC with differential unremarkable, CMP unremarkable except  elevated glucose and GFR 52.  PT/INR shows INR of 2.5.  X-ray of the  right ankle shows right tibial fibular distal fracture.   ASSESSMENT AND PLAN:  1. Right tibiofibular fracture status post mechanical fall.  2. History of atrial fibrillation on Coumadin.  3. History of  coronary artery disease status post one stent.  4. History of gastroesophageal reflux disease.  5. History of hyperlipidemia.  6. History of osteoporosis.  7. History of congestive heart failure.  8. History of neuropathy.  9. History of hypertension which is uncontrolled right now.  10.History of Alzheimer's dementia.  11.History of depression.  12.History of anxiety.  13.History of left renal cell cancer which has been surgically removed      and has not been documented to be recurred at this time.  14.History of tobacco use.  15.History of aneurysm repair.  16.History of pacemaker placement.  17.History of cataract.   PLAN:  Will admit the patient on a telemetry bed under [facility]  with a diagnosis of right tibiofibular fracture status post fall and  history of atrial fibrillation and uncontrolled hypertension.  At this  time will check CBC, CMP, PT/PTT, INR in the morning.  Will hold  Coumadin for now with possible surgery.  The patient will be allowed to  bed rest with a Foley cathete, will provide oxygen by nasal cannula.  Will provide normal saline at 50 mL per hour for hydration.  The patient  can have water, ice chips and medicines after midnight.  Will start  Lovenox and Nexium for DVT and GI prophylaxis respectively.  Will  provide morphine q.3 h p.r.n. for pain.  Will start IV Zofran p.r.n.  along with Tylenol, Ambien, Ativan and laxative of choice for  symptomatic treatment.  Will continue Toprol XL at 50 mg p.o. 1/2 tablet  daily, Norvasc 5 mg daily for hypertension, otherwise she is stable.  Will continue to hold home medication including Coumadin at this time.  Home medication doses are not available and will be  restarted in the morning.  As the patient is stable further plan  according to the workup pending.  The patient is evaluated by orthopedic  surgeon, Dr. Victorino Dike in the emergency department prior to my examination  and he wants [place] to admit the patient  and he will look at that imaging  and decide about surgical options in the morning.      Donalynn Furlong, MD  Electronically Signed     TVP/MEDQ  D:  08/15/2008  T:  08/15/2008  Job:  [id number]   cc:   Dr. Alphonsus Sias

## 2010-05-23 NOTE — Assessment & Plan Note (Signed)
[facility]                                 ON-CALL NOTE   NAME:[name], [name]                      MRN:          [id number]  DATE:05/19/2006                            DOB:          09-Jun-1928    Patient of Dr. Alphonsus Sias.  [name] called from [phone] at 3:44  p.m. on May 19, 2006 complaining that she (her mom) had blood in her  urine and was not feeling well.  I recommended she go to Urgent Care to  be evaluated.     [name], DO  Electronically Signed    Shawnie Dapper  DD: 05/19/2006  DT: 05/20/2006  Job #: [id number]   cc:   Karie Schwalbe, MD

## 2010-05-23 NOTE — Consult Note (Signed)
[name], [name]               ACCOUNT NO.:  [id number]   MEDICAL RECORD NO.:  [id number]          PATIENT TYPE:  INP   LOCATION:  4703                         FACILITY:  [facility]   PHYSICIAN:  Excell Seltzer. Annabell Howells, M.D.    DATE OF BIRTH:  May 08, 1928   DATE OF CONSULTATION:  05/20/2006  DATE OF DISCHARGE:                                 CONSULTATION   Patient of Barbette Hair. Artist Pais, DO   CHIEF COMPLAINT:  Gross hematuria.   HISTORY:  [name] is a 77 year old white female with a history of  atrial fibrillation who is on Coumadin.  She had the onset, Saturday  morning, of bright red blood after urination.  She has urinary  incontinence; and wears pads, and saw the blood in the pad.  The blood  was intermittent but recurred with clots.  Sunday morning, she came to  the emergency room where a urinalysis was found to be nitrite positive  with pyuria and hematuria consistent with a UTIs.  A Foley catheter was  inserted with return of bloody urine with clots.  She was admitted for  management of her UTI.  She reports some frequency of urination with her  Lasix.  She had no dysuria.  She has been complaining of low back and  lower abdominal pain for an undetermined period of time.  She denies any  history of stones, prior UTIs, or urinary surgery.  She has no  difficulty voiding.   PAST HISTORY/ALLERGIES:  Pertinent for no drug allergies.   ADMISSION MEDICATIONS INCLUDE:  1. Lasix 80 mg daily.  2. Lexapro 10 mg daily.  3. Aricept 5 mg daily.  4. Norvasc 5 mg daily.  5. Vytorin 10/40 daily.  6. Coumadin.  7. Toprol XL 50 mg daily.  8. Diovan 160 mg daily.  9. Imodium as needed.  10.Pepcid AC as needed.   MEDICAL HISTORY:  1. Pertinent for atrial fibrillation on Coumadin.  2. History of bradycardia with a pacemaker.  3. History of coronary artery disease with prior MI.  4. History of PTCA with stent in 2003.  5. History of depression/anxiety  6. History of memory loss and dementia.  7.  History of peripheral neuropathy.  8. Osteoporosis.  9. Bilateral cataract surgery.   GYNECOLOGICAL HISTORY:  She is gravida 3, para 3 with vaginal  deliveries.   SOCIAL HISTORY:  She is a former smoker with a 20-pack-year history.  She denies alcohol use.  She has a daughter and two sons.   FAMILY HISTORY:  Pertinent for alcoholism.  Mother had emphysema.  Father had Hodgkin's disease.  Another brother had pancreatic cancer.   REVIEW OF SYSTEMS:  She denies chest pain or shortness of breath.  She  has a history of some lower abdominal discomfort; and has had some fecal  incontinence.  She denied dysuria.  She has had no fever or chills.  She  is otherwise entirely without complaints.   PHYSICAL EXAMINATION:  VITAL SIGNS:  On exam her blood pressure 128/61,  heart rate 59, temperature 97.1, respirations 20.  GENERAL:  She is a  well-developed, well-nourished white female in no  acute distress, alert and oriented x3.  HEAD AND FACE:  Normocephalic, atraumatic.  NECK:  Supple.  LUNGS:  Normal effort.  HEART:  Regular rate and rhythm.  ABDOMEN:  Soft, moderately distended with some suprapubic discomfort.  No CVA tenderness, mass, or hepatosplenomegaly are noted.  No hernias  are noted.  There is no inguinal adenopathy.  GENITOURINARY/RECTAL:  Exams were deferred -- will do that at time of  cystoscopy.  EXTREMITIES:  Full range of motion without edema.  SKIN:  Warm and dry.   LABORATORY STUDIES:  Demonstrate white count of 4.4, hemoglobin of 12.7  with a repeat of 13.6.  On admission her ProTime was 26 with an INR of  2.2.  CMP was normal with the exception of a BUN that was minimally  elevated at 25.  Her creatinine was 0.8. Her urinalysis had ketones was  40; blood was large.  She had greater than 300 mg/dL of protein.  Urine  was nitrite positive with moderate leukocytes.  She had 0-2 white cells,  too numerous to count red cells, and no bacteria were noted.   IMPRESSION:  1.  Gross hematuria in a patient on Coumadin for atrial fibrillation.  2. Probable urinary tract infection.  3. History of tobacco use.   PLAN:  I would like to have her changed to a 22-French Foley catheter  with irrigation as needed.  She currently has a 16-French.  A CT of  abdomen and pelvis with and without contrast should be obtained to  evaluate the kidneys and ureters.  Will need cystoscopy when we have a  better idea about her UTI status.  I would continue to hold the Coumadin  until her urine is clear.      Excell Seltzer. Annabell Howells, M.D.  Electronically Signed    JJW/MEDQ  D:  05/20/2006  T:  05/20/2006  Job:  [id number]   cc:   Barbette Hair. Artist Pais, DO  Karie Schwalbe, MD

## 2010-05-23 NOTE — Discharge Summary (Signed)
[name], [name]               ACCOUNT NO.:  [id number]   MEDICAL RECORD NO.:  [id number]          PATIENT TYPE:  INP   LOCATION:  1440                         FACILITY:  [facility]   PHYSICIAN:  Heloise Purpura, MD      DATE OF BIRTH:  [birth year]   DATE OF ADMISSION:  06/13/2006  DATE OF DISCHARGE:  06/16/2006                               DISCHARGE SUMMARY   ADMISSION DIAGNOSES:  1. Left renal mass.  2. Hematuria.   DISCHARGE DIAGNOSES:  1. Renal cell carcinoma.  2. Hematuria.   PROCEDURES:  1. Cystoscopy.  2. Left laparoscopic radical nephrectomy.   HISTORY AND PHYSICAL:  For full details, please see admission history  and physical.  Briefly, [name] is a 77 year old female who was found  to have gross hematuria.  She had been on anticoagulation for atrial  fibrillation; this was discontinued and a hematuria evaluation was  performed.  This demonstrated a 6.2-cm heterogeneous enhancing left  renal mass concerning for malignancy.  She underwent metastatic  evaluation which was negative.  After discussing options, the patient  elected to proceed with cystoscopy under anesthesia along with a left  laparoscopic radical nephrectomy with intraoperative pathologic  evaluation to determine whether this was a renal cell carcinoma versus a  possible urothelial carcinoma.   HOSPITAL COURSE:  On June 13, 2006, the patient was taken to the  operating room.  She underwent a cystoscopy which did not demonstrate  any evidence of urothelial malignancy of the bladder.  She then  underwent a left laparoscopic radical nephrectomy with intraoperative  analysis which did demonstrate findings concerning for a renal cell  carcinoma.  She tolerated the procedure well and was admitted to the  hospital for routine postoperative care.  Her hemoglobin remained stable  at 9.7.  She was noted to be moderately anemic on evaluation and did not  have significant intraoperative blood loss.  She was kept on  a cardiac  monitor due to her history of atrial fibrillation.  She remained in  sinus rhythm for the majority of her hospitalization with no episodes of  hemodynamic instability or tachycardia.  She was able to begin a clear  liquid diet on postoperative day #1.  She did have some abdominal  distention associated with nausea, which subsequently resolved.  She did  have return of bowel function and was able to be advanced to a regular  diet by postoperative day #3.  She was able to begin ambulating with the  aid of a walker.  A physical therapy consult was obtained and it was  recommended that she receive home health physical therapy upon  discharge.  By the afternoon of postoperative day #3, she was tolerating  a regular diet without problems.  Her renal function remained stable  with a creatinine of 1.0.  She was therefore able to be discharged home  with home physical therapy that afternoon.   DISPOSITION:  Home.   DISCHARGE MEDICATIONS:  The patient was instructed to resume her regular  home medications except for her Coumadin.  The patient was given a  prescription  to take Vicodin as needed for pain and to use Colace as a  stool softener.   FOLLOWUP:  The patient was instructed to schedule an appointment with  her primary care physician, Dr. Alphonsus Sias, for the following week to  reinstitute her anticoagulation.   DISCHARGE INSTRUCTIONS:  The patient was instructed to try to be  ambulatory with assistance as necessary.  She was told to refrain from  any heavy lifting, strenuous activity, or driving.  She was told that  she may resume a regular diet.   PATHOLOGY:  The patient's final surgical pathology did demonstrate a  T1b, NX, MX, Furman grade 3 clear cell renal cell carcinoma with  negative surgical margins.   FOLLOWUP:  [name] will plan to follow up in approximately 1 week for  further postoperative evaluation and removal of her staples.            ______________________________  Heloise Purpura, MD  Electronically Signed     LB/MEDQ  D:  06/16/2006  T:  06/17/2006  Job:  [id number]

## 2010-05-23 NOTE — Assessment & Plan Note (Signed)
[facility]                                 ON-CALL NOTE   NAME:[name], [name]                        MRN:          [id number]  DATE:05/25/2006                            DOB:          1928-03-08    PRIMARY CARE Kenyan Karnes:  Dr. Alphonsus Sias.   [name] [name] daughter calls in stating that she was discharged from the  hospital yesterday after being diagnosed over the week with renal cell  carcinoma.  Since she left the hospital, she has had diarrhea  frequently.  She does show signs of mild dehydration with a dry mouth.  According to the daughter she is also on the water pill.  She was  getting Imodium in the hospital p.r.n. and the daughter also gave her  Imodium last night, but with no significant improvement.   PLAN:  I did advise the daughter to take [name] [name] to the emergency  department for reassessment and possible rehydration.  I additionally  advised her to take all of the medicines she is currently taking,  including the water pill. The daughter expressed understanding.     Leanne Chang, M.D.  Electronically Signed    LA/MedQ  DD: 05/25/2006  DT: 05/25/2006  Job #: [id number]

## 2010-05-23 NOTE — Op Note (Signed)
NAMECREEDENCE, HEISS               ACCOUNT NO.:  [id number]   MEDICAL RECORD NO.:  [id number]          PATIENT TYPE:  INP   LOCATION:  0003                         FACILITY:  [facility]   PHYSICIAN:  Heloise Purpura, MD      DATE OF BIRTH:  1928/10/19   DATE OF PROCEDURE:  06/13/2006  DATE OF DISCHARGE:                               OPERATIVE REPORT   PREOPERATIVE DIAGNOSIS:  1. Left renal mass.  2. Hematuria.   POSTOPERATIVE DIAGNOSIS:  1. Left renal mass.  2. Hematuria.   OPERATION/PROCEDURE:  1. Flexible cystoscopy.  2. Left laparoscopic radical nephrectomy.   SURGEON:  Crecencio Mc, M.D.   ASSISTANT:  Terie Purser, MD   ANESTHESIA:  General.   COMPLICATIONS:  None.   ESTIMATED BLOOD LOSS:  100 mL.   IV FLUIDS:  1500 mL of lactated Ringer's.   SPECIMEN:  Left kidney and proximal ureter.   DISPOSITION OF SPECIMEN:  To pathology.   INDICATIONS:  Mrs. Pieratt is a  77 year old female who has been on  Coumadin due to atrial fibrillation.  She was recently found to have  hematuria and her anticoagulation was discontinued.  A urology  consultation was obtained and Dr. Bjorn Pippin evaluated the patient first  with the CT scan.  This demonstrated a 6.2 cm heterogeneous and  enhancing left renal mass concerning for a renal malignancy.  She  underwent metastatic evaluation which was negative.  She also underwent  a preoperative cardiac evaluation and was felt to be an acceptable risk  for surgery despite her comorbidities.  After discussing options for  management, she elected to proceed with the above procedures.  Potential  risks and benefits were discussed.  Informed consent was obtained.   DESCRIPTION OF PROCEDURE:  The patient was taken to the operating room  and a general anesthetic was administered.  She was given preoperative  antibiotics, placed with her legs in a frogleg position.  She was  prepped and draped in the usual sterile fashion.  A flexible  urethroscopy was  performed.  This demonstrated normal urethra.  Examination of the bladder demonstrated the ureteral orifices to be in  the normal anatomic position and effluxing clear urine.  The bladder was  then systematically surveyed and there was noted to be a small  submucosal nodule toward the anterior aspect of the dome of the bladder.  This did not appear consistent with a urothelial malignancy.  No other  lesions were identified.  Based on the patient's age, comorbidities and  likelihood that this represented a benign lesion, and we did not perform  a biopsy and elected to proceed with surveillance of this lesion at this  time.  The patient was then placed in the modified left flank position.  All potential pressure points were padded.  The patient's abdomen was  then prepped and draped in the usual sterile fashion.  A site was  selected just superior to the umbilicus for placement of camera port.  This was placed using a standard open Hassan technique which allowed  entry into the peritoneal cavity under direct vision.  A 10 mm Hassan  cannula was then placed and secured to the abdomen with fascial sutures.  The abdomen was insufflated and inspected.  There was no evidence of any  intra-abdominal injuries or other abnormalities.  The remaining ports  were then placed.  A 12 mm port was placed in the left lower quadrant.  A 5 mm port was placed in the left upper quadrant and an additional 5 mm  port was placed in the lateral abdominal wall.   With the aid of the Harmonic scalpel, the white line of Toldt was  incised along the length of the descending colon, allowing the space  between the colonic mesentery and the anterior layer of Gerota's fascia  to be developed.  The gonadal vein ureter were identified and lifted  anteriorly off the psoas fascia.  The gonadal vein was traced up toward  the renal vein.  The patient was noted on her preoperative imaging to  have a retroaortic renal vein.  A  renal artery was identified just  superior and anterior to the renal vein.  This was isolated and divided  between multiple Hem-o-lok clips.  The renal vein below the renal artery  was similarly isolated and also divided between multiple Hem-o-lok  clips.  On inspection, the patient was noted to have two additional  veins draining the superior aspect of the kidney.  These were fairly  sizable veins and were also divided between multiple Hem-o-lok clips.  Attention then turned to the superior aspect of the kidney where  Gerota's fascia was entered, allowing the adrenal plan to be preserved  intentionally.  The attachments to the spleen were then taken down with  the Harmonic scalpel, freeing the superior pole of the kidney.   Attention then turned inferiorly and the gonadal vein was isolated and  divided between Hem-o-lok clips.  The ureter was dissected free down  toward the common iliac artery in case the patient were to need a  nephroureterectomy.  The lateral attachments of the kidney were then  divided and the ureter was divided between clips.  The EndoCatch II bag  was then placed through the camera port site and the specimen was placed  into this bag.  It was brought out through the periumbilical incision  which was extended slightly around the left side of the umbilicus and  specimen was removed intact within the bag.  It was sent for  intraoperative frozen section analysis due to its central location.  This was found to be consistent with a renal cell carcinoma.  The  opening in the periumbilical fascia was then reapproximated with a  running 0 Vicryl suture.  The renal fossa was reinspected.  There  appeared to be good hemostasis.  A piece of Surgicel was placed over the  adrenal bed to aid with hemostasis, however.  A 0 Vicryl suture was used  to close the left lower quadrant 12-mm port site.  The remaining ports removed under direct vision and pneumoperitoneum was evacuated.   The  patient appeared to tolerate the procedure well and without  complications.  She was able to be awakened and transferred to recovery  in satisfactory condition.           ______________________________  Heloise Purpura, MD  Electronically Signed     LB/MEDQ  D:  06/13/2006  T:  06/13/2006  Job:  [id number]

## 2010-05-23 NOTE — Assessment & Plan Note (Signed)
[facility]                                 ON-CALL NOTE   NAME:INGLEJacquette, Canales                      MRN:          [id number]  DATE:05/23/2006                            DOB:          [birth year]    OUTPATIENT ON CALL NOTE:   TIME OF CALL:  Was 6:36 p.m.   TELEPHONE NUMBER:  Is [phone].   CALLER:  Alferd Patee, a daughter.   I just spoke with the patient less than 15 minutes ago about her mother  who was discharged from the hospital today who is having significant  shortness of breath and cold sweats, now is feverish and has nausea.  I  told the daughter to go ahead and call 911 and have the patient  transported to the hospital to be seen.  There is a possibility in my  mind she could be septic with a urinary infection if nothing else,  possible a pulmonary embolism.  There are multiple things that could be  going on.  I would have the patient evaluated in the emergency room.   PRIMARY CARE PHYSICIAN:  Karie Schwalbe, M.D.   HOME OFFICE:  [place].     Arta Silence, MD  Electronically Signed    RNS/MedQ  DD: 05/23/2006  DT: 05/23/2006  Job #: [id number]

## 2010-05-26 NOTE — Discharge Summary (Signed)
[place]  Patient:    [name], [name] Visit Number: [id number] MRN: [id number]          Service Type: MED Location: [facility] 2908 [id number] Attending Physician:  Virgina Evener Dictated by:   Halford Decamp Delanna Ahmadi, R.N., N.P. Admit Date:  07/14/2001 Disc. Date: 07/20/01                             Discharge Summary  HISTORY OF PRESENT ILLNESS:  The patient is a 77 year old white female who came in through the emergency room after and episode of severe weakness and diaphoresis.  She apparently had awakened at 6 a.m. and she felt normal.  She was going to a restaurant for breakfast about 7 a.m.  After about driving for five miles, she became very diaphoretic with hair dripping of sweat, face felt cold and clammy.  She had no chest pain or syncope, but did have nauseated feeling.  She came to the emergency room and was seen by Dr. Tresa Endo.  She was in atrial fibrillation.  She had no prior history of coronary artery disease. Her EKG was abnormal with a questionable old anterior septal MI and questionable inferior changes.  Her first CK-MB came back at 427/55 with a troponin of 6.3.  It was decided that she should undergo emergent catheterization.  HOSPITAL COURSE:  This was performed by Dr. Tresa Endo on July 14, 2001.  It showed that she had ulcerated plaque in her proximal RCA.  She also had thrombus at the bifurcation of the PDA/PLA.  She had a total LAD after her diagonal-1 which was felt to be old.  She did have right to left collaterals.  She had no renal artery stenosis, no iliac artery stenosis.  She had hypokinesis of the apical and anterior wall.  She underwent a very difficult intervention and had a Cypher stent placed, 3.0 x 30 mm to her proximal to mid right coronary artery.  She did have some transient asystole and bradycardia requiring temporary pacemaker insertion.  Please see Dr. Ellin Goodie dictated note for complete details of the complex  procedure.  Postprocedure, she did have rebleed of her groin and a Doppler was done where she was found to have a complex pseudoaneurysm.  She became anemic secondary to acute blood loss and received two units of packed red blood cells.  Her hemoglobin was 8.5 and hematocrit 25.  She was seen by Dr. Hart Rochester for possible repair of her complex pseudoaneurysm.  He recommended this be done and on July 17, 2001, she underwent pseudoaneurysm repair.  Postprocedure, she had been placed on aspirin and Plavix.  Her heparin had been stopped after it was known she had the pseudoaneurysm.  This was not restarted.  After her surgery, she will eventually need to be placed on Coumadin because of her atrial fibrillation of unknown duration.  She will need to have aspirin and Plavix for two weeks and then she will be on Coumadin and Plavix for at least three to six months.  She did have postoperative Dopplers performed arterially and these were normal.  She then ambulated with cardiac rehabilitation.  They recommended a rolling walker.  This was ordered. On July 19, 2001, she was having some bouts of diarrhea.  A C. difficile was sent.  This came back negative.  The diarrhea resolved.  On July 20, 2001, she was seen by Dr. Chanda Busing and considered to be stable  for discharge home.  LABORATORY DATA AND X-RAY FINDINGS:  On July 19, 2001, her sodium was 138, potassium 4.1, glucose 111, BUN 12, creatinine 0.7.  TSH on July 11, was 0.309.  Hemoglobin on admission was 12.7, hematocrit 38.2.  On July 16, 2001, her hemoglobin was 8.5, hematocrit 25.2.  On July 18, 2001, her hemoglobin was 11.0, hematocrit 32.3, WBC 7.8, platelets 111.  Glucose was somewhat elevated during her hospitalization to a high of 128, mostly in the 110 range.  On July 14, 2001, CK-MB 427/55 with a troponin of 6.33; second set 542/77 with a troponin of 27; third set 453/36 with a troponin of 21.  A lipid profile on July 14, 2001, showed  total cholesterol of 196, triglycerides 129, HDL 52, VLDL 26 and LDL 118.  Total cholesterol/HDL ratio 3.8.  TSH on admission was 0.346, FT4 1.5, T3 140.  Urine did show some positive nitrates, moderate amount of hemoglobin on July 17, 2001.  Chest x-ray on July 17, 2001, showed no acute abnormality or cardiomegaly. Her EKG showed some right bundle branch block, inferior infarct with atrial fibrillation on July 16, 2001.  She did have a 2D echocardiogram on July 15, 2001, showing ejection fraction of 35%.  She had mild diffuse LV hypokinesis, akinesis of the anterior wall and inferior hypokinesis of the anterior septal wall.  She had mild MVR.  Left atrium was markedly dilated at 55 mm.  The right atrium was moderately to markedly dilated.  DISCHARGE MEDICATIONS: 1. Zocor 40 mg at bedtime every night. 2. Plavix 75 mg once per day. 3. Enteric coated aspirin 81 mg once per day. 4. Altace 2.5 mg once per day. 5. Metoprolol 25 mg twice per day. 6. Enteric coated aspirin every day.  ACTIVITY:  No strenuous activity, no lifting or driving until seen in the office and she is released to increase her activity.  DIET:  Low saturated fat diet.  SPECIAL INSTRUCTIONS:  She will call for any problems.  She will be on aspirin and Plavix x2 weeks and then she will start Coumadin and Plavix and continue the Plavix for three to six months.  FOLLOWUP:  She will follow up with Dr. Tresa Endo in one week to 10 days.  She will call our office on Monday for an appointment.  She will follow up with Dr. Hart Rochester in approximately two weeks.  Their office will call her for an appointment.  DISCHARGE DIAGNOSES: 1. Acute inferior myocardial infarction, status post complex procedure.    with Cypher stent placed to her proximal to mid right coronary artery. 2. Ischemic cardiomyopathy with an ejection fraction of 35% per    two-dimensional echocardiogram. 3. Status post pseudoaneurysm repair after cardiac  catheterization. 4. Dyslipidemia. 5. Intermittent hypotension, postprocedure. 6. Proximal atrial fibrillation.  She presented with atrial fibrillation.  7. Diarrhea, resolved at the time of discharge.  DISCHARGE PHYSICAL EXAMINATION:  Blood pressure 114/32, heart rate 78, respiratory rate 20, in atrial fibrillation.  Her room air O2 saturations were 98%.  DISCHARGE LABORATORY DATA AND X-RAY FINDINGS:  Sodium 137, potassium 3.6, BUN 16, creatinine 0.6, glucose 104. Dictated by:   Halford Decamp Delanna Ahmadi, R.N., N.P. Attending Physician:  Virgina Evener DD:  07/20/01 TD:  07/20/01 Job: [id number]

## 2010-05-26 NOTE — Consult Note (Signed)
NAMESUNYA, [name]               ACCOUNT NO.:  [id number]   MEDICAL RECORD NO.:  [id number]          PATIENT TYPE:  INP   LOCATION:  3706                         FACILITY:  [facility]   PHYSICIAN:  Marlan Palau, M.D.  DATE OF BIRTH:  [birth year]   DATE OF CONSULTATION:  02/02/2005  DATE OF DISCHARGE:                                   CONSULTATION   HISTORY OF PRESENT ILLNESS:  [name] is a 77 year old white female  born [birth year] with a history of dementia on Aricept and chronic gait  disturbance that has been present since 2003.  The patient has been using a  walker to get around but has had progression of her walking problems.  The  patient has peripheral neuropathy and this is felt to be part of her problem  with the walking.  The patient has come into the hospital at this point for  problems with sick sinus syndrome, atrial fibrillation and has had a cardiac  pacer placement.  Since this admission, the patient has had some problems  with double vision that is horizontal in nature.  The patient notes the  double vision when she cuts her eyes to the right.  Double vision is not  present all the time but seems to come and go.  The patient denies any  slurred speech, trouble swallowing, headache problems.  The patient does  note some urinary urgency at times.  The patient tends to fall backwards  when walking.  The patient denies any neck pain or pain down the arms.  Due  to the ongoing gait problem, the patient is being seen by neurology.  Thyroid was also checked with a somewhat low TSH at 0.244.  The patient has  not had prior history of double vision prior to this admission.   PAST MEDICAL HISTORY:  Significant for:  1.  History of organic brain syndrome with Alzheimer's disease.  2.  Coronary artery disease.  History of MI times two in the past.  Right      common carotid artery stent.  3.  History of sick sinus syndrome, status post pacemaker placement.  4.   History of atrial fibrillation.  5.  Hypertension.  6.  Peripheral vascular disease.  7.  Osteoporosis.  8.  Peripheral neuropathy.  9.  Left retinal hemorrhage in the past.  10. Bilateral cataract surgery.   MEDICATIONS AT HOME:  1.  Aricept 10 mg a day.  2.  Altace 10 mg a day.  3.  Vytorin 10/40 mg daily.  4.  Norvasc 5 mg daily.  5.  Lexapro 10 mg a day.  6.  Coumadin 7.5 mg a day.  7.  Lyrica 75 mg b.i.d. for neuropathy.  8.  The patient is on Boniva monthly.   THE PATIENT HAS NO KNOWN ALLERGIES.   SOCIAL HISTORY:  Does not smoke or drink.  This patient lives in the  [place] area.  Lives with her husband.  The patient is  retired.   FAMILY MEDICAL HISTORY:  Her mother died with emphysema.  Her  father died  with Hodgkin's disease.   REVIEW OF SYSTEMS:  Notable for no recent fevers or chills.  The patient  denies any headache, slurred speech, trouble swallowing, trouble with  chewing.  The patient denies any ptosis of the eyes.  Does have some double  vision as above.  Denies shortness of breath, chest pains, abdominal pains.  Denies any new numbness or weakness of arms, legs or face.  Has not had any  recent blackout episodes.  The patient does feel unsteady with walking,  feels better if she is holding onto something.   PHYSICAL EXAMINATION:  VITALS:  Blood pressure is 115/65, heart rate 77,  respiratory rate 20, temperature afebrile.  GENERAL:  This patient is a minimally obese white female who is alert and  cooperative at the time of examination.  HEENT EXAMINATION:  Head is atraumatic.  Eyes:  Pupils postsurgical, round,  reactive to light.  Discs soft and flat bilaterally.  NECK:  Supple.  No carotid bruits noted.  RESPIRATORY EXAMINATION:  Clear.  CARDIOVASCULAR EXAMINATION:  Reveals a regular rate and rhythm.  No obvious  murmurs or rubs noted.  EXTREMITIES:  Without significant edema.  High arches are noted.  NEUROLOGIC EXAMINATION:   Cranial nerves as above.  Facial symmetry is  present.  The patient has good sensation in the face to pinprick and soft  touch bilaterally.  Has good strength of masseter muscles, muscles of head  turning, and shoulder shrug bilaterally.  Speech is well enunciated, not  aphasic.  Extraocular movements appear to be full.  Cover test is negative.  The patient has no evidence of an aphasia on exam.  Motor testing reveals  5/5 strength in all four with good symmetric motor tone and strength  throughout.  Sensory testing is intact to pinprick and soft touch sensation  throughout.  No stocking/glove pinprick sensory deficit was noted.  Vibratory sensation is minimally impaired in the feet, normal in the arms.  Position sense is minimally impaired in the feet and normal in the arms.  The patient has fair finger-nose-finger and toe-to-finger bilaterally.  Gait  is significantly impaired.  The patient has a tendency to lean backwards.  Cannot walk without assistance.  Romberg is positive.  No drift is seen.  Deep tendon reflexes are somewhat brisk throughout.  One to two beats of  clonus at the knees are noted bilaterally.  Ankle jerks are depressed  bilaterally.  Toes are neutral bilaterally.   LABORATORY VALUES:  Notable for a white count of 3.7, hemoglobin of 11.0,  hematocrit of 31.4, MCV of 83.7, platelets of 124, INR of 1.1.  The patient  has TSH of 0.244, sodium 140, potassium 3.7, chloride 103, CO2 30, glucose  97, BUN 19, creatinine 0.6, calcium 9.5, total protein 6.5 albumin 3.9, AST  23, ALT 18, alkaline phosphatase 64, total bilirubin 1.1, amylase 101,  lipase 40.  Urinalysis reveals specific gravity of 1.014, pH of 5.0.  EKG  reveals electronic ventricular paced rhythm, heart rate of 60.   IMPRESSION:  1.  History of dementia.  2.  Gait disorder.  3.  Peripheral neuropathy.  4.  Double vision.  5.  Atrial fibrillation.  6.  Coronary artery disease.  7.  Status post pacemaker  placement.  This patient has had progression of memory and gait over a number of years.  Do need to consider and rule out the possibility of multi-infarct state.  The gait disorder above can be seen with  multi-infarct dementia and with  normal pressure hydrocephalus and cervical spine disease.  Parkinson's  disease can also cause same type of gait disorder, but the patient has no  parkinsonian features at this point.  Will need to rule out metabolic causes  of problems such as B12 deficiency and ruling out syphilis.  The patient  will likely need physical therapy.  The double vision is in a pattern that  could be consistent with an abducens palsy, but cerebrovascular disease may  also produce similar problems.   PLAN:  1.  CT of the head.  2.  Check blood work for B12, methylmalonic acid level, sed rate, RPR,      acetylcholine receptor antibody levels.  3.  Physical therapy evaluation.   Will follow the patient's clinical course while in house.  Thank you very  much.      Marlan Palau, M.D.  Electronically Signed     CKW/MEDQ  D:  02/02/2005  T:  02/02/2005  Job:  [id number]   cc:   Darlin Priestly, MD  Fax: [phone]

## 2010-05-26 NOTE — H&P (Signed)
[name], [name] NO.:  [id number]   MEDICAL RECORD NO.:  [id number]          PATIENT TYPE:  OIB   LOCATION:  3706                         FACILITY:  [facility]   PHYSICIAN:  Darlin Priestly, MD  DATE OF BIRTH:  Aug 22, 1928   DATE OF ADMISSION:  01/28/2005  DATE OF DISCHARGE:                                HISTORY & PHYSICAL   CHIEF COMPLAINT:  This is a 77 year old cardiology patient of Dr. Evlyn Clines but Dr. Darlin Priestly has seen her for sick sinus syndrome and  is planning a permanent pacemaker on January 29, 2005.   HISTORY OF PRESENT ILLNESS:  This 77 year old white female is followed in  cardiology by Dr. Tresa Endo, secondary to coronary artery disease documented  total LAD occlusion proximally with collaterals.  She has had intervention  in 2003, to her RCA.  She has a history of hypertension, chronic paroxysmal  atrial fibrillation, neuropathy and mild chronic obstructive pulmonary  disease.  The patient is on Coumadin.  The patient had complained of feeling  fatigued.  Her heart rate was slow and the Toprol was discontinued.  Her  Cardizem was cut back.  She had undergone a Persantine Cardiolite study,  revealing a scar involving the apex to distal inferior to the inferior  lateral wall, as well as scar involving the anterior septal wall with mild  ischemia involving the anterior septal wall concordant with her known LAD  occlusion of collaterals and prior inferior myocardial infarction.   She underwent Holter monitor.  Her heart rate would drop down as low as 26  to 37 and as high as 117.  She has sick sinus syndrome and needs a  transvenous pacemaker.  The patient was seen and assessed by Dr. Jenne Campus in  the office, and this was scheduled as an elective procedure.  The patient is  on chronic Coumadin therapy.  She is here today for a heparin/Coumadin  crossover.   PAST MEDICAL HISTORY:  1.  Coronary artery disease as stated.  2.  Chronic  obstructive pulmonary disease.  3.  Hypertension.  4.  Significant neuropathy of her lower extremities diagnosed about one year      ago, with a neurologist in [place].  No known cause of the      neuropathy.  5.  Significant back pain,  unsure of that.  6.  She has a history of falls and does walk with a walker.  7.  History of mild Alzheimer's.  8.  Osteoporosis.  9.  Gastroesophageal reflux disease.  10. Depression.  11. She has been having frequent urination.  She is on Lasix which she takes      in the morning.  She is wearing Depends and voids before she can      actually get to the bathroom.  12. Additionally, she has been having more frequent BM's.  They are more      soft than loose, but frequently, and they are significant enough that      she does not make it to the bathroom for the stools either.  Again, is      wearing Depends, and that has again not been worked up.   OUTPATIENT MEDICATIONS:  1.  Furosemide 40 mg, 1-1/2 tab daily.  2.  Nexium 40 mg once daily.  3.  Altace 10 mg q.h.s.  4.  Vytorin 10/40 daily.  5.  Valium 20 mg b.i.d.  6.  Boniva monthly.  7.  Norvasc 5 mg daily.  8.  Aricept 10 mg daily.  9.  Lexapro 10 mg daily.  10. Coumadin 7.5 mg as told, and we follow that in our office.  11. Lyrica 75 mg b.i.d.  She has been on that for some time.  After talking      to the pharmacy, this does not usually cause diarrhea or loose stools.   ALLERGIES:  No known drug allergies.   FAMILY HISTORY:  Mother had emphysema.  Father had Hodgkin's disease.  A  brother had three bypasses at age 31.   SOCIAL HISTORY:  Widowed.  Three children and seven grandchildren.  She is  using a walker due to the neuropathy.  She does have balance issues.  She is  retired.  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  She quit smoking several years ago.   REVIEW OF SYSTEMS:  GENERAL:  No weight loss.  No colds or fever.  SKIN:  Without rashes.   HEENT:  No sinus infections.  No blurred vision.  The  patient also has decreased hearing.  CARDIOVASCULAR:  No chest pain.  PULMONARY:  No shortness of breath.  A remote history of tobacco.  ABDOMEN:  No abdominal pain but frequent stools.  GENITOURINARY:  Frequent urination  with inability to control urination.  EXTREMITIES:  Positive neuropathy.  Has difficulty feeling her legs with increased imbalance.  She uses a  walker.   PHYSICAL EXAMINATION:  VITAL SIGNS:  Today blood pressure 127/56, pulse 73,  respirations 18, temperature 98.6 degrees, oxygen saturation on room air is  95%.  GENERAL:  Alert and oriented x3.  Moves all extremities.  She has __________  capillary refill.  HEENT:  Sclerae clear.  Hard of hearing.  NECK:  Supple, no jugular venous distention at 90 degrees.  No obvious  carotid bruits.  LUNGS:  Right base has some crackles.  Otherwise clear.  HEART:  Sounds are regular.  Stable rate.  A soft 1/6 systolic murmur.  ABDOMEN:  Soft, nontender.  Positive bowel sounds.  Cannot palpate the  liver, spleen or masses.  EXTREMITIES:  Without edema.  NEUROLOGIC:  Alert and oriented x3.  Moves all extremities.  Follows  commands.  Answers questions appropriately.   IMPRESSION:  1.  Sick sinus syndrome with tachycardia and bradycardia, for a permanent      pacemaker in the morning.  2.  Chronic atrial fibrillation, here for heparin/Coumadin crossover.  3.  Frequent bowel movements.  4.  Frequent urination.  5.  Coronary artery disease.  6.  Neuropathy, etiology idiopathic.   PLAN:  Admit and draw laboratory work.  Will include stools for Giardia and  Clostridium difficile, ova & parasites, culture & sensitivity.  Check urine  for C&S.  Urinalysis as well.  Will ask for Depends for the patient.  Monitor and  check chest x-ray.  She had a few crackles in her base.  Will also go ahead and start heparin for INR of 2 or less.  Plans are for a permanent pacemaker  in the  morning, depending upon her laboratory values and chest x-ray.  Dr.  Dani Gobble saw her and assessed her with me.      Darcella Gasman. Valarie Merino      Darlin Priestly, MD  Electronically Signed    LRI/MEDQ  D:  01/28/2005  T:  01/28/2005  Job:  [id number]   cc:   Nicki Guadalajara, M.D.  Fax: [phone]   [facility]

## 2010-05-26 NOTE — Op Note (Signed)
[place]. [facility]  Patient:    [name], [name] Visit Number: [id number] MRN: [id number]          Service Type: MED Location: [facility]UB 2908 01 Attending Physician:  Virgina Evener Dictated by:   Quita Skye Hart [place], M.D. Proc. Date: 07/17/01 Admit Date:  07/14/2001 Discharge Date: 07/20/2001                             Operative Report  PREOPERATIVE DIAGNOSIS: Pseudoaneurysm right femoral artery following cardiac catheterization.  POSTOPERATIVE DIAGNOSIS: Pseudoaneurysm right superficial femoral artery following cardiac catheterization.  OPERATION: Repair of pseudoaneurysm right superficial femoral artery with Fogarty thrombectomy, right superficial femoral artery.  SURGEON: Quita Skye. Hart [place], M.D.  FIRST ASSISTANT: Maple Mirza, P.A.  ANESTHESIA: General endotracheal.  DESCRIPTION OF PROCEDURE: The patient was taken to the operating room, placed in the supine position at which time the right groin was prepped with Betadine scrub and solution after induction of satisfactory general endotracheal anesthesia. A longitudinal incision was made in the right inguinal region, carried down through subcutaneous tissue. There was a large pulsatile mass at the inguinal crease, probably 3.5 to 4 cm in diameter and thrombus was encountered just beneath the skin incision. Control of the common femoral artery at the level of the inguinal ligament was obtained before getting into the pseudoaneurysm space, and 5000 units of heparin given intravenously. The artery was then occluded and the false aneurysm sac entered. There was a fairly clean stick originating from the proximal superficial femoral artery on the medial to the posterior aspect, and this is where the bleeding was occurring. Control of the superficial femoral and profunda femoris artery was obtained, and the injured vessel was visualized. There did not appear to be any intimal injury, and therefore  initially this was closed with two continuous 6-0 Prolene sutures. The clamp was released. There continued to be some diminished Doppler flow and superficial femoral artery, and diminished dorsalis pedis flow in the foot. Therefore, another short transverse arteriotomy was made in the distal common femoral artery. There was no thrombus in this. A three Fogarty catheter were easily ______ to the ankle region. Upon return, no thrombus was retrieved but it did seem that the superficial femoral artery had been in spasm. It would accept a 4 mm dilator now. The artery was reclosed with two 6-0 Prolene sutures. The clamp was released and there was an excellent pulse now in the superficial femoral artery and the dorsalis pedis artery in the foot. No Protamine was given. The wound was irrigated with saline, closed in layers with Vicryl in a subcuticular fashion with Steri-Strips. Sterile dressing applied and the patient taken to the recovery room in satisfactory condition. Dictated by:   Quita Skye Hart [place], M.D. Attending Physician:  Virgina Evener DD:  07/17/01 TD:  07/20/01 Job: [id number]

## 2010-05-26 NOTE — Discharge Summary (Signed)
[place]. [facility]  Patient:    [name], [name] Visit Number: [id number] MRN: [id number]          Service Type: MED Location: [facility]UB 2908 01 Attending Physician:  Virgina Evener Dictated by:   Halford Decamp Delanna Ahmadi, R.N., N.P. Admit Date:  07/14/2001   CC:         Quita Skye. Hart [place], M.D.   Discharge Summary  CONTINUATION TO JOB #[id number]  ADDENDUM:  Carbon copy in header. Dictated by:   Halford Decamp Delanna Ahmadi, R.N., N.P. Attending Physician:  Virgina Evener DD:  07/20/01 TD:  07/20/01 Job: [id number]

## 2010-05-26 NOTE — Consult Note (Signed)
. [facility]  Patient:    [name], [name] Visit Number: [id number] MRN: [id number]          Service Type: MED Location: [facility]UB 2908 01 Attending Physician:  Virgina Evener Dictated by:   Quita Skye Hart [place], M.D. Proc. Date: 07/16/01 Admit Date:  07/14/2001                            Consultation Report  REASON FOR CONSULTATION:  Pseudoaneurysm of right femoral artery, post cardiac catheterization and intervention.  HISTORY OF PRESENT ILLNESS:  I appreciate seeing this 77 year old female patient in consultation regarding a pseudoaneurysm of the right femoral artery.  This patient was admitted on July 14, 2001, with severe weakness and diaphoresis, and was found to have severe coronary artery disease on cardiac catheterization.  She underwent a PTCA and stent by Dr. Nicki Guadalajara of the right coronary artery.  The procedure was a difficult intervention and there was some bleeding around the puncture site in the femoral artery which required insertion of a 10-French sheath to control the oozing.  The sheath was removed on the evening of July 14, 2001, and the patient continued to have intermittent bleeding from this site which eventually ceased.  After heparin was reinstituted, the patient developed a pulsatile mass in the right inguinal region which has now persisted.  A Duplex scan of this today revealed a complex pseudoaneurysm of the right femoral artery.  No evidence of AV fistula or extensive hematoma formation.  PAST MEDICAL HISTORY:  Negative for previous symptoms of coronary artery disease.  She denies CVA, TIA, amaurosis fugax, hypertension, deep venous thrombosis, thrombophlebitis, pulmonary emboli, diabetes mellitus, or hypercholesteremia.  FAMILY HISTORY:  Positive for emphysema and coronary artery disease (brother had coronary artery bypass grafting in his 60s).  SOCIAL HISTORY:  The patient smoked up to a half pack of cigarettes per  day for 30 years.  No alcohol and quit smoking two months ago.  MEDICATIONS:  Tylenol occasionally for headache.  ALLERGIES:  No known drug allergies.  REVIEW OF SYSTEMS:  The patient had peptic ulcer disease 20 years ago which resolved.  Denies any active GI, GU, or neurologic symptoms.  Also denies claudication.  PHYSICAL EXAMINATION:  VITAL SIGNS:  Blood pressure 160/80, heart rate 80, respirations 14.  GENERAL:  She is a well-developed and well-nourished female who is in no apparent distress.  She is alert and oriented x3.  NECK:  Supple with 3+ carotid pulses.  No bruits are audible.  NEUROLOGICAL:  Exam is normal.  No palpable adenopathy in the neck.  Upper extremity pulses 2+ bilaterally.  CHEST:  Clear to auscultation.  CARDIOVASCULAR:  Exam reveals a regular rhythm with no murmurs.  ABDOMEN:  Soft and nontender with no masses.  EXTREMITIES:  Revealed diffuse ecchymosis in the right inguinal region with a pulsatile mass in the right femoral artery, measuring 3 x 4 cm with no large hematoma noted.  She has a palpable 2+ popliteal and 2+ dorsalis pedis pulse in the right leg.  The left leg has 3+ femoral, 2+ popliteal, and 2+ dorsalis pedis pulses.  There is no distal edema and no evidence of ischemia.  IMPRESSION:  Pseudoaneurysm of right femoral artery following cardiac intervention.  RECOMMENDATION:  We will proceed with repair of the pseudoaneurysm in the operating room tomorrow under general anesthesia as it is felt that she will tolerate this.  She may require a  patch angioplasty of the femoral artery if the injury to the vessel is complex. Dictated by:   Quita Skye Hart Rochester, M.D. Attending Physician:  Virgina Evener DD:  07/16/01 TD:  07/19/01 Job: [id number]

## 2010-05-26 NOTE — Op Note (Signed)
[name], [name]               ACCOUNT NO.:  [id number]   MEDICAL RECORD NO.:  [id number]          PATIENT TYPE:  OIB   LOCATION:  3706                         FACILITY:  [facility]   PHYSICIAN:  Darlin Priestly, MD  DATE OF BIRTH:  12/12/1928   DATE OF PROCEDURE:  01/29/2005  DATE OF DISCHARGE:                                 OPERATIVE REPORT   PROCEDURE PERFORMED:  Insertion of a Medtronic Vitatron T20SR generator  serial number [id number] with single passive ventricular lead.   ATTENDING PHYSICIAN:  Darlin Priestly, MD   COMPLICATIONS:  None.   INDICATIONS FOR PROCEDURE:  [name] is a 77 year old female patient of Dr.  Daphene Jaeger with a history of hypertension, history of chronic atrial  fibrillation, history of CAD.  She has recently complained of increasing  fatigue and presyncopal symptoms.  She was noted to have significant  bradycardia in the office and ultimately underwent Holter monitor placement  revealing intermittent episodes of 3 to 3-1/2 second pauses with variable  heart rates down to 26 and as high as 117.  She is known to have a normal EF  by echo in May of 2006. Her last Cardiolite scan in October 2006 revealed  inferior scar with mild anteroseptal and apical ischemia.  She is now  referred for single chamber pacer implant secondary to symptomatic  bradycardia and sick sinus syndrome.   DESCRIPTION OF PROCEDURE:  After obtaining informed consent the patient was  brought to the cardiac catheterization lab where the left chest was prepped  and draped in sterile fashion.  ECG monitoring was established.  1%  lidocaine was then used to anesthetize the left midsubclavicular area.  Next, approximately 3 cm horizontal mid infraclavicular incision was then  carried out and hemostasis obtained with electrocautery.  Blunt dissection  used to carry this down to left pectoral fascia.  Approximately 3 x 4 cm  pocket was then created over the left pectoral fascia.  The  left subclavian  vein was then easily entered using fluoroscopic guidance and a guidewire was  then easily passed to the left subclavian and SVC.  A #9 French dilator and  sheath were then easily passed over the retained guidewire and the dilator  and guidewire were removed.  Following this, a Medtronic 58 cm passive lead,  model #4092, serial #[id number] was then easily passed into the right  atrium.  A guidewire was retained.  The peel-away sheath was removed.  The  guidewire was then fastened to the sheath with a hemostat.  A J-curve was  then placed on the ventricular lead stylet.  Ventricular lead was then  allowed to cross the tricuspid valve and position in the RV apex without  difficulty.  R-waves were measured at 10.6 mV.  Impedance was 695 ohms.  Threshold was 0.4 V at 0.5 msec.  Current was 0.7 mA.  10 V negative for  diaphragmatic stimulation.  Two silk sutures were then placed around the  lead which was then secured to the left pectoral fascia. The pocket was then  copiously irrigated with 1% Kanamycin solution,  again hemostasis was  confirmed.  The lead was then connected to a Vitatron generator, model T20SR  serial number [id number].  Head screw was tightened and pacing was  confirmed.  A single silk suture was then placed in the apex of the pocket.  The generator and leads were then delivered to the pocket and the head was  then secured to the silk suture.  The subcutaneous layer was then closed  using running 2-0 Vicryl. The skin layers were then closed using running 4-0  Vicryl.  Steri-Strips then applied.  The patient was then transferred to the  recovery room in stable condition.   CONCLUSION:  Successful implant of a Vitatron generator, model T20SR serial  number [id number] generator with a single passive ventricular lead.      Darlin Priestly, MD  Electronically Signed     RHM/MEDQ  D:  01/29/2005  T:  01/29/2005  Job:  [id number]   cc:   Nicki Guadalajara, M.D.   Fax: [phone]

## 2010-05-26 NOTE — Cardiovascular Report (Signed)
[place]. [facility]  Patient:    [name], [name] Visit Number: [id number] MRN: [id number]          Service Type: MED Location: [facility] [id number] [id number] Attending Physician:  Virgina Evener Dictated by:   Lennette Bihari, M.D. Proc. Date: 07/14/01 Admit Date:  07/14/2001   CC:         Darien Ramus, P.A.-C.  [facility] Govern   Cardiac Catheterization  INDICATIONS:  [name] is a 77 year old white female without known prior cardiac disease, who was awakened this morning and felt normal.  She was on her way to go to a restaurant for breakfast when she became markedly diaphoretic, clammy, noted an irregular pulse, and some mild heaviness in her chest.  There was nausea without vomiting.  She presented to [facility] emergency room later in the morning.  ECG showed atrial fibrillation and there was evidence for right bundle branch block with Q waves V1 through V4 suggesting probable old anteroseptal MI.  There also was a suggestion of mild ST segment changes inferiorly suggesting possibly that this may be acute vessel.  CPK was 427 with an MB fraction of 55.8, troponin 6.3. She was taken to the cardiac catheterization laboratory.  DESCRIPTION OF PROCEDURE:  The patient was prepped and draped in the usual fashion.  Right femoral artery was punctured anteriorly and a 7 French sheath was inserted without difficulty.  Diagnostic catheterization was done with 6 French Judkins 4 left coronary catheter.  The right coronary artery had an anterior takeoff and a 6 French right No-Torque were unsuccessful in cannulating this.  Ultimately an AL1 was successful in engaging the right coronary artery.  Pigtail catheter was used for biplane cine left ventriculography as well as distal aortography.  With the demonstration of total proximal LAD with bridging collaterals as well as left collaterals it was felt that this was old especially in light of  the patients ECG.  However, the right coronary artery had diffuse disease with significant thrombus burden suggesting this was the acute vessel.  At this time, the decision was made to attempt intervention of the right coronary artery.  Double bolus Integrilin and weight-adjusted heparinization 3600 units were administered.  Initially a 7 [place] AL1 guide was inserted.  However, this catheter appeared fairly stiff and did not seem to be able to cannulate the ostium as the diagnostic catheter had done.  Consequently there was significant torquing that was required. This ultimately lead to the catheter kinking and it seemed to kink in the region of the sheath.  Under fluoroscopy it appeared that the kinking was also within the sheath.  Multiple attempts at unkinking this were made and attempts at passing a wire through this were unsuccessful.  Since a large portion of the guide was basically in the aorta extending to the thoracic knob, it was felt the only way that this could be removed was to pull the sheath and kinked portion out and then cut the guide and then insert the guide wire through the cut catheter and insert a larger size sheath for groin control.  Consequently, an 8 [place] sheath was inserted as well as a 9 [place] sheath, but there was still significant oozing.  Consequently, a 10 French sheath was inserted which resulted in good hemostasis.  Additional guiding catheters were then employed to try to engage the right coronary artery.  While we were in between guiding catheters, the patient had a prolonged period of  asystole of at least 7 seconds and then had marked bradycardia.  A temporary pacemaker was then inserted and advanced through the RV apex.  Multiple additional guiding catheters were used including an AR1, an AR2, hockeystick, Amplatz 0.75, another Amplatz1, all unsuccessful.  Ultimately an AT4R was successful in cannulating the right coronary artery.  A Luge wire was  then advanced down the RCA.  A 2.5 x 30 mm CrossSail balloon was advanced to the proximal mid RCA and sequential inflations were made up to 10 atmospheres.  Balloon was also then advanced distally to the bifurcation in an attempt to dotter the previously documented bifurcation clot.  Numerous times, the guide was not able to provide adequate support and there was difficulty each time in reengaging this catheter into the ostium.  A 3.0 x 33 mm Cypher drug-eluding stent was then inserted with careful attention not to jail the midanterior RV marginal branch, but to include the significantly irregular and diseased proximal to mid right coronary artery.  This was dilated to 15 atmospheres. Postdilatation was done with a 3.5 x 300 mm CrossSail balloon with careful attention to keep the dilatation within the stented segment.  Scout angiography confirmed a very good angiographic result with the 70, 95, and 80% stenoses with ulcerated plaque resolved with resolution of prior clot, and being reduced to 0% with TIMI 3 flow.  In addition, there was resolution of the bifurcation clot in the distal RCA PDA, but there appeared to be distal PDA cutoff.  The patient was given additional heparin 2000 units per ACT documentation.  The procedure was greater than three hours and difficult, but ultimately successful.  The pacemaker was sutured in place as was the 10 [place] arterial sheath with plans for sheath removal later as tolerated.  HEMODYNAMIC DATA:  Central aortic pressure was 130/80, left ventricular pressure was 130/20.  ANGIOGRAPHIC DATA: 1. The left main coronary artery was a long normal vessel that bifurcated into    an LAD and left circumflex system. 2. The LAD gave rise to a small first diagonal vessel and moderate size second    diagonal vessel which was proximally prior to the septal perforating    artery.  There was diffuse narrowing of 60 to 70% in the first diagonal     vessel.  The LAD  was totally occluded after the septal perforating artery.    There was evidence for bridging collaterals supplying the mid LAD.  In    addition, from the RCA injection, there also collaterals supplying the    apical portion of the LAD right to left. 3. The circumflex vessel had 20% proximal narrowing and 20% narrowing in    the AV groove.  The vessel gave rise to one major marginal vessel. 4. The right coronary artery had 50% proximal stenosis.  The proximal to    mid RCA was diffusely diseased with 70%, 95%, and 80% stenoses with    ulcerated plaque eccentrically with thrombus.  There was 30% narrowing    beyond the crux.  There was clot present at the bifurcation of the distal    RCA and PDA at the ostium.  Biplane cine left ventriculography revealed moderate LV dysfunction. There was hypokinesis of the mid anterolateral wall with akinesis of the distal anterolateral wall and apex extending to the distal inferoapical segment. Hypocontractility was also present in the mid to basal inferior wall.  On the LAO projection there was significant hypo to akinesis involving the septum extending to  the inferoapical segment with hypo to akinesis of the inferoapical to low posterolateral wall.  Distal aortography did not demonstrate any renal artery stenosis.  Following complex difficult, but ultimately successful coronary intervention on the right coronary artery, the diffuse 70, 95, and 80% eccentric stenoses with ulcerated plaque and clot were reduced to 0% following insertion of a 3.0 x 30 mm Cypher stent postdilated to 3.5 mm vessel size.  There was no change in the most proximal 50% region of narrowing.  There was resolution of prior clot in the mid RCA and also resolution of clot at the ostium of the PDA and also in the continuation branch of the distal RCA.  There did appear to be distal PDA vessel cutoff probably from embolized clot that had been present ostially.  There also was right  to left collaterals of the distal apical portion of the left anterior descending artery.  IMPRESSION: 1. Acute inferior wall myocardial infarction. 2. Moderate left ventricular dysfunction with hypocontractility of the    mid anterolateral wall, akinesis of the mid distal anterolateral wall    and apex, hypocontractility in the mid to basal posterior wall with hypo    to akinesis of the septum and inferoapical segment. 3. Probable old total occlusion of the LAD after the second diagonal vessel    with 60 to 70% stenosis in the second diagonal vessel and with evidence    for bridging collaterals supplying the mid LAD as well as right to left    collaterals. 4. 20% proximal and 20% mid circumflex narrowing. 5. 50% proximal RCA narrowing with 70, 95, and 80% irregular plaque in the    proximal to mid right coronary artery with significant thrombus and    evidence for initial probable embolized thrombus involving the bifurcation    of the PDA and distal right coronary artery. 6. Transient asystole/bradycardia requiring temporary pacemaker insertion. 7. Difficult, but successful PTCA/stenting of the proximal to mid right    coronary artery utilizing a 3.0 x 30 mm Cypher stent with postdilatation up    to 3.5 mm done with double bolus Integrilin, weight-adjusted heparinization. Dictated by:   Lennette Bihari, M.D. Attending Physician:  Virgina Evener DD:  07/14/01 TD:  07/16/01 Job: [id number]

## 2010-05-26 NOTE — Discharge Summary (Signed)
[name], [name]               ACCOUNT NO.:  [id number]   MEDICAL RECORD NO.:  [id number]          PATIENT TYPE:  INP   LOCATION:  3706                         FACILITY:  [facility]   PHYSICIAN:  Darlin Priestly, MD  DATE OF BIRTH:  1928-04-06   DATE OF ADMISSION:  01/28/2005  DATE OF DISCHARGE:  02/06/2005                                 DISCHARGE SUMMARY   DISCHARGE DIAGNOSIS:  1.  Chronic atrial fibrillation with sick sinus syndrome status post      Medtronic pacer implant January 29, 2005.  2.  Gait disorder seen by Dr. Lesia Sago this admission.  3.  Prior history of coronary disease with documented total left anterior      descending occlusion with collaterals and prior right coronary artery      PCA.  4.  Mild chronic obstructive pulmonary disease.  5.  Good left ventricular function by echocardiogram May of 2006.  6.  Questionable mild dementia.  7.  Abnormal TSH with normal T3 and T4.  8.  Pancytopenia.   HOSPITAL COURSE:  Ms. [name] is a pleasant 77 year old female followed by Dr.  Daphene Jaeger.  She has a history of coronary disease as noted above.  She had  an RCA CYPHER stent placed in January of 2003.  She has total LAD with  collaterals.  Her last Cardiolite study was in October of 2006 and was low  risk.  There was some ischemia in the anteroseptal and into the lateral  wall.  She has good LV function.  She has a history of falling and unsteady  gait and mild Alzheimer's.  She has been worked up by a neurologist in  [place] for this.  She has a known chronic atrial fibrillation with sick  sinus syndrome and pauses and recent falls and dizziness.  She was seen by  Dr. Jenne Campus in December 2006 and was felt it would be best to proceed with  elective pacemaker implant.  This was done January 29, 2005 with a Medtronic  device.  She is tolerating this well.  Postoperatively she was put on  heparin and Coumadin.  She did have a significant amount of ecchymosis, but  no  hematoma.  The patient's daughter requested a neurologic consult while  she was hospitalized for patient's complaints of chronic dizziness and  periodic diplopia.  Patient was seen and evaluated by Dr. Anne Hahn.  See his  consult for complete details.  Essentially, we could not find a specific  cause for the patient's symptoms.  He suggest the patient follow up with her  neurologist in [place].  The patient's INR is 2 on the 30th and we feel  she can be discharged.  Her Steri-Strips will be removed prior to discharge.  She will need a pacer check in a couple months with Dr. Jenne Campus.  At  discharge she is noted to be pancytopenic which was present throughout her  admission.   LABORATORIES AT DISCHARGE:  White count 2.9, hemoglobin 11.1, hematocrit  32.2, platelets 138.  INR is 2.  Sodium 141, potassium 2.8, BUN 20,  creatinine  0.7.  Liver functions are normal.  TSH is 0.22.  A repeat TSH was  0.24, T4 is 9.7, T3 133.6, both T3 and T4 were within normal limits.  B12  level is normal at 733.  Urinalysis is unremarkable.  Urine culture is  negative.  RPR is negative.  C. difficile toxin is negative.  Stool culture  is negative.  CT of the spine showed no fractures.  There was some  degenerative disk disease in C4 and C5 and C5 and 6 and there was foraminal  stenosis at C4 and C5 and C5 and C6.  CT of the head without contrast showed  mild to moderate diffuse and cerebellar atrophy, but no acute abnormality.  Chest x-ray showed COPD.  INR at discharge 2.  Telemetry shows atrial  fibrillation with V-pacing.   DISPOSITION:  Patient is discharged in stable condition.  She will have a  pacer check in a couple months with Dr. Jenne Campus.  She has been instructed to  follow up with her family doctor in [place].      [name], P.A.      Darlin Priestly, MD  Electronically Signed    LKK/MEDQ  D:  02/06/2005  T:  02/06/2005  Job:  [id number]   cc:   Liana Crocker, M.D.  [facility]

## 2010-06-13 ENCOUNTER — Other Ambulatory Visit (HOSPITAL_COMMUNITY): Payer: Self-pay | Admitting: Urology

## 2010-06-13 ENCOUNTER — Ambulatory Visit (HOSPITAL_COMMUNITY)
Admission: RE | Admit: 2010-06-13 | Discharge: 2010-06-13 | Disposition: A | Discharge status: Home / Self Care | Payer: Medicare Other | Source: Ambulatory Visit | Attending: Urology | Admitting: Urology

## 2010-06-13 DIAGNOSIS — Z87891 Personal history of nicotine dependence: Secondary | ICD-10-CM | POA: Insufficient documentation

## 2010-06-13 DIAGNOSIS — I517 Cardiomegaly: Secondary | ICD-10-CM | POA: Insufficient documentation

## 2010-06-13 DIAGNOSIS — Z01818 Encounter for other preprocedural examination: Secondary | ICD-10-CM | POA: Insufficient documentation

## 2010-06-13 DIAGNOSIS — C649 Malignant neoplasm of unspecified kidney, except renal pelvis: Secondary | ICD-10-CM | POA: Insufficient documentation

## 2010-09-18 IMAGING — CR DG CHEST 2V
2 series · 2 of 2 positions shown · non-contrast
Comparison: 06/18/2007

CLINICAL DATA: Renal cell carcinoma.  Follow-up examination.

CHEST - 2 VIEW

[w chest pa]
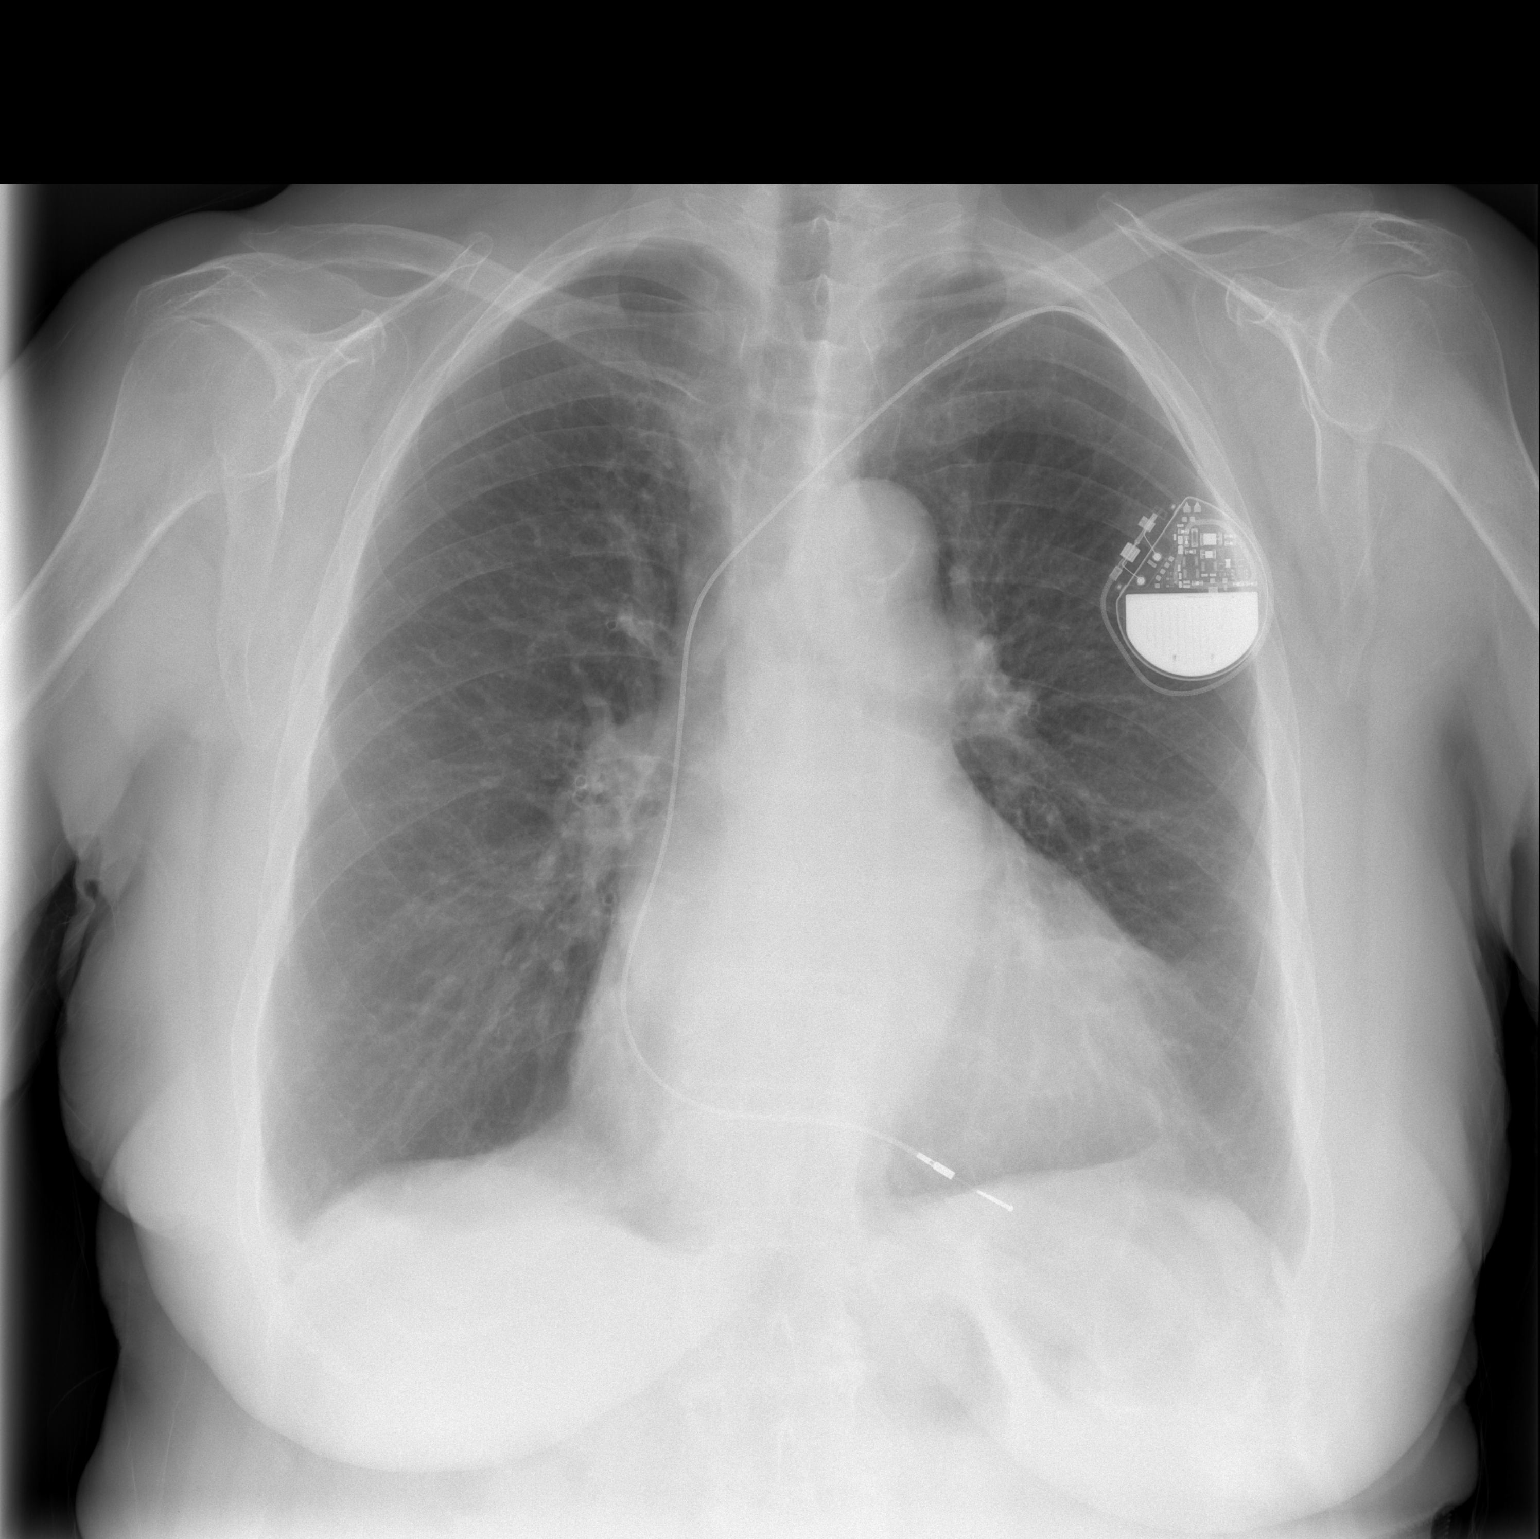

[w chest lat]
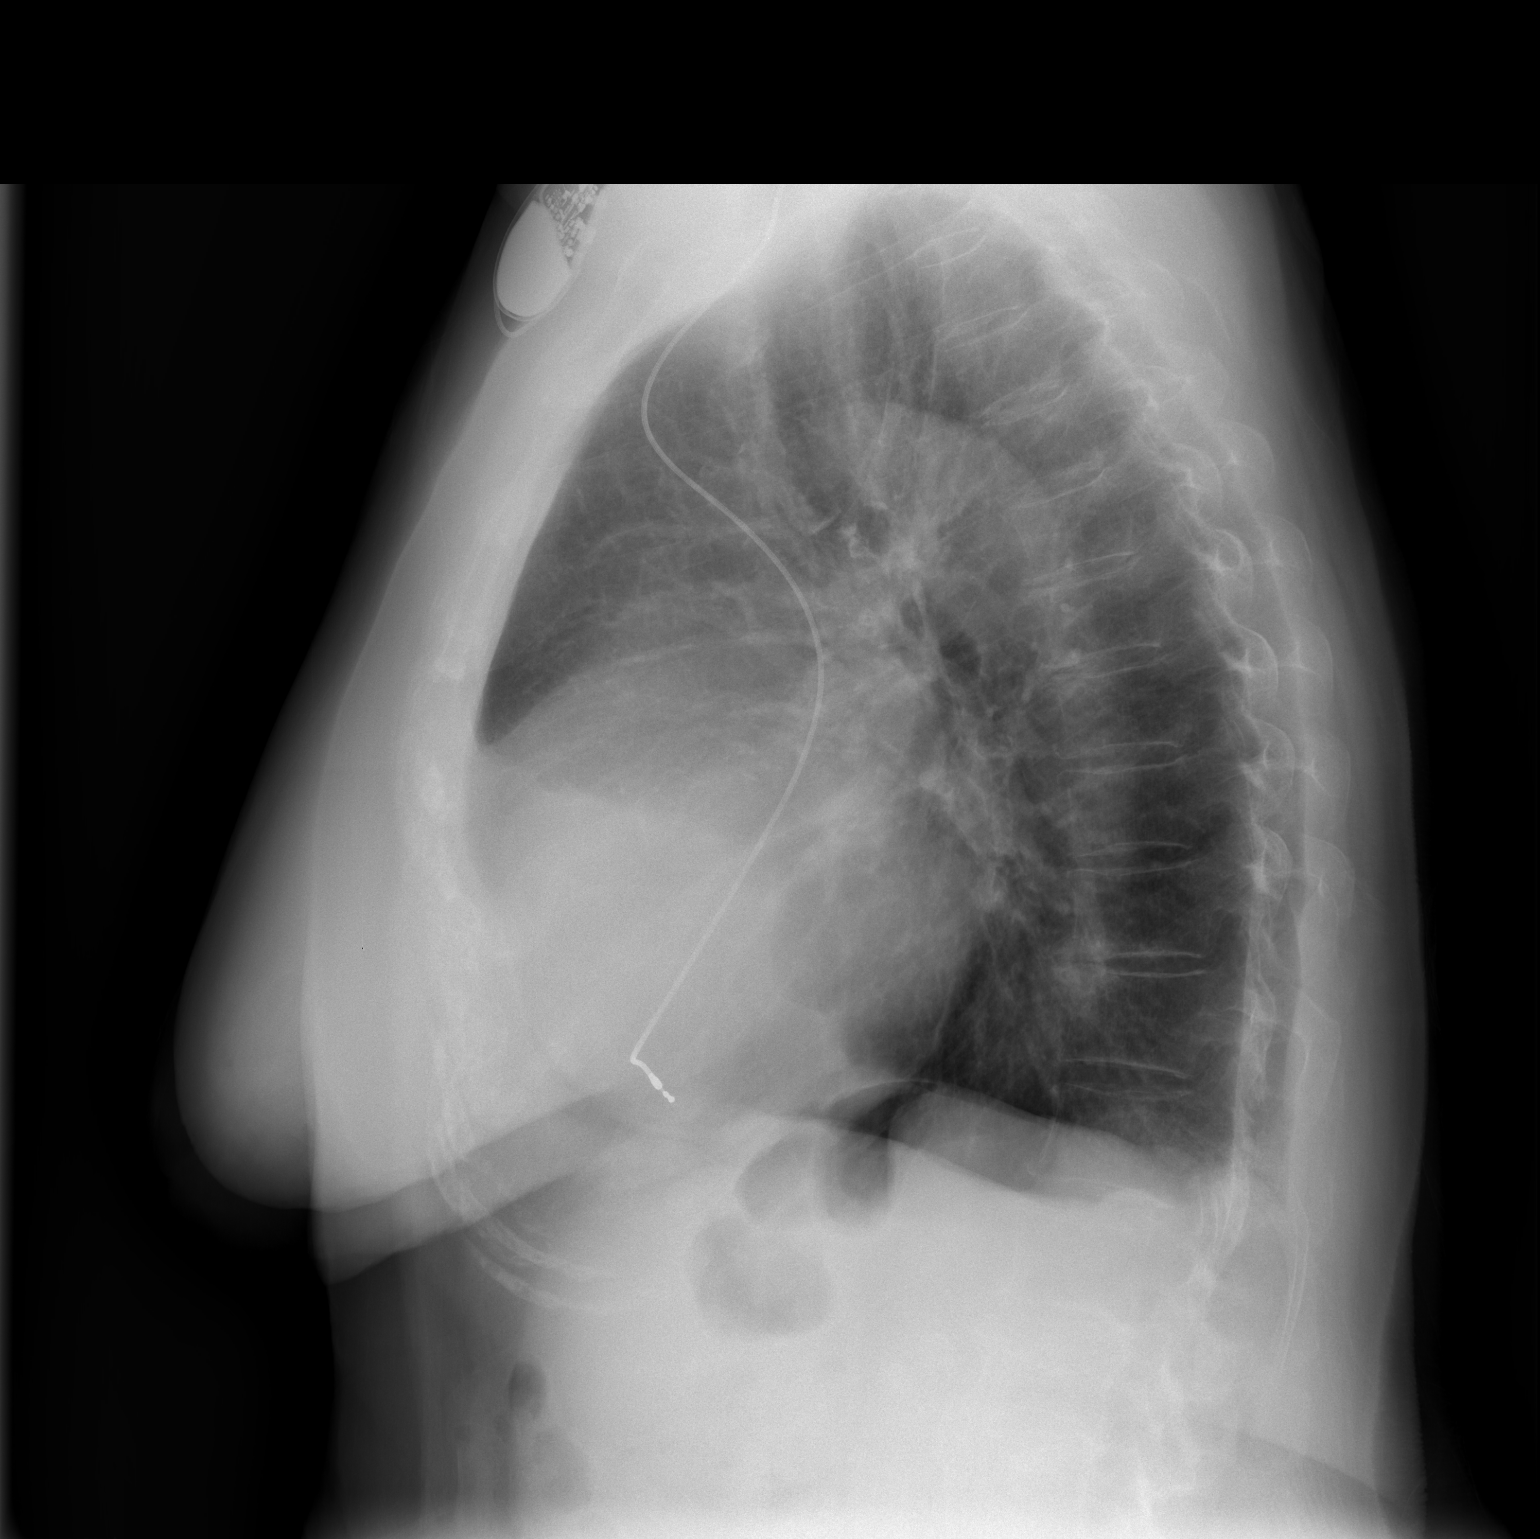

[2 of 2 positions shown; findings below may reference images not displayed]

FINDINGS: Two views of the chest were obtained.  The patient has a
single lead cardiac pacer with a lead overlying the right
ventricle.  Stable appearance of the heart and mediastinum.  The
lungs are clear without focal disease.  Stable scarring at the left
lung base.  Bony structures appear intact.
IMPRESSION: Stable chest radiograph findings.  No evidence for active disease.

## 2010-10-23 LAB — BASIC METABOLIC PANEL
Chloride: 101
GFR calc Af Amer: >60
Potassium: 4.1
Sodium: 138

## 2010-10-23 LAB — CBC
HCT: 34.1 — ABNORMAL LOW
MCV: 80.1
RBC: 4.26
WBC: 2.9 — ABNORMAL LOW

## 2010-10-23 LAB — APTT: aPTT: 30

## 2010-10-26 LAB — CBC
Hemoglobin: 9.9 — ABNORMAL LOW
MCHC: 32.9
MCHC: 33.1
MCV: 83.9
MCV: 85.3
Platelets: 190
Platelets: 261
RBC: 3.34 — ABNORMAL LOW
RBC: 4.03
RDW: 14.4 — ABNORMAL HIGH
WBC: 7.2

## 2010-10-26 LAB — BASIC METABOLIC PANEL
BUN: 6
BUN: 7
CO2: 26
CO2: 31
Calcium: 10
Calcium: 8.3 — ABNORMAL LOW
Calcium: 8.6
Calcium: 8.8
Chloride: 101
Chloride: 103
Chloride: 105
Chloride: 98
Creatinine, Ser: 0.63
Creatinine, Ser: 0.84
Creatinine, Ser: 0.94
Creatinine, Ser: 1
GFR calc Af Amer: >60
GFR calc Af Amer: >60
GFR calc Af Amer: >60
GFR calc non Af Amer: 54 — ABNORMAL LOW
GFR calc non Af Amer: >60
Glucose, Bld: 170 — ABNORMAL HIGH

## 2010-10-26 LAB — TYPE AND SCREEN
ABO/RH(D): A POS
Antibody Screen: NEGATIVE

## 2010-10-26 LAB — HEMOGLOBIN AND HEMATOCRIT, BLOOD
HCT: 29.3 — ABNORMAL LOW
Hemoglobin: 9.7 — ABNORMAL LOW

## 2010-10-26 LAB — MAGNESIUM
Magnesium: 2
Magnesium: 2.1

## 2010-10-26 LAB — PROTIME-INR
INR: 1.1
Prothrombin Time: 13.9

## 2011-05-18 IMAGING — CR DG CHEST 2V
1 series · 1 of 1 positions shown · non-contrast
Comparison: 08/15/2008 and 06/14/2008

CLINICAL DATA: Cough and chest congestion.

CHEST - 2 VIEW

[w chest lat]
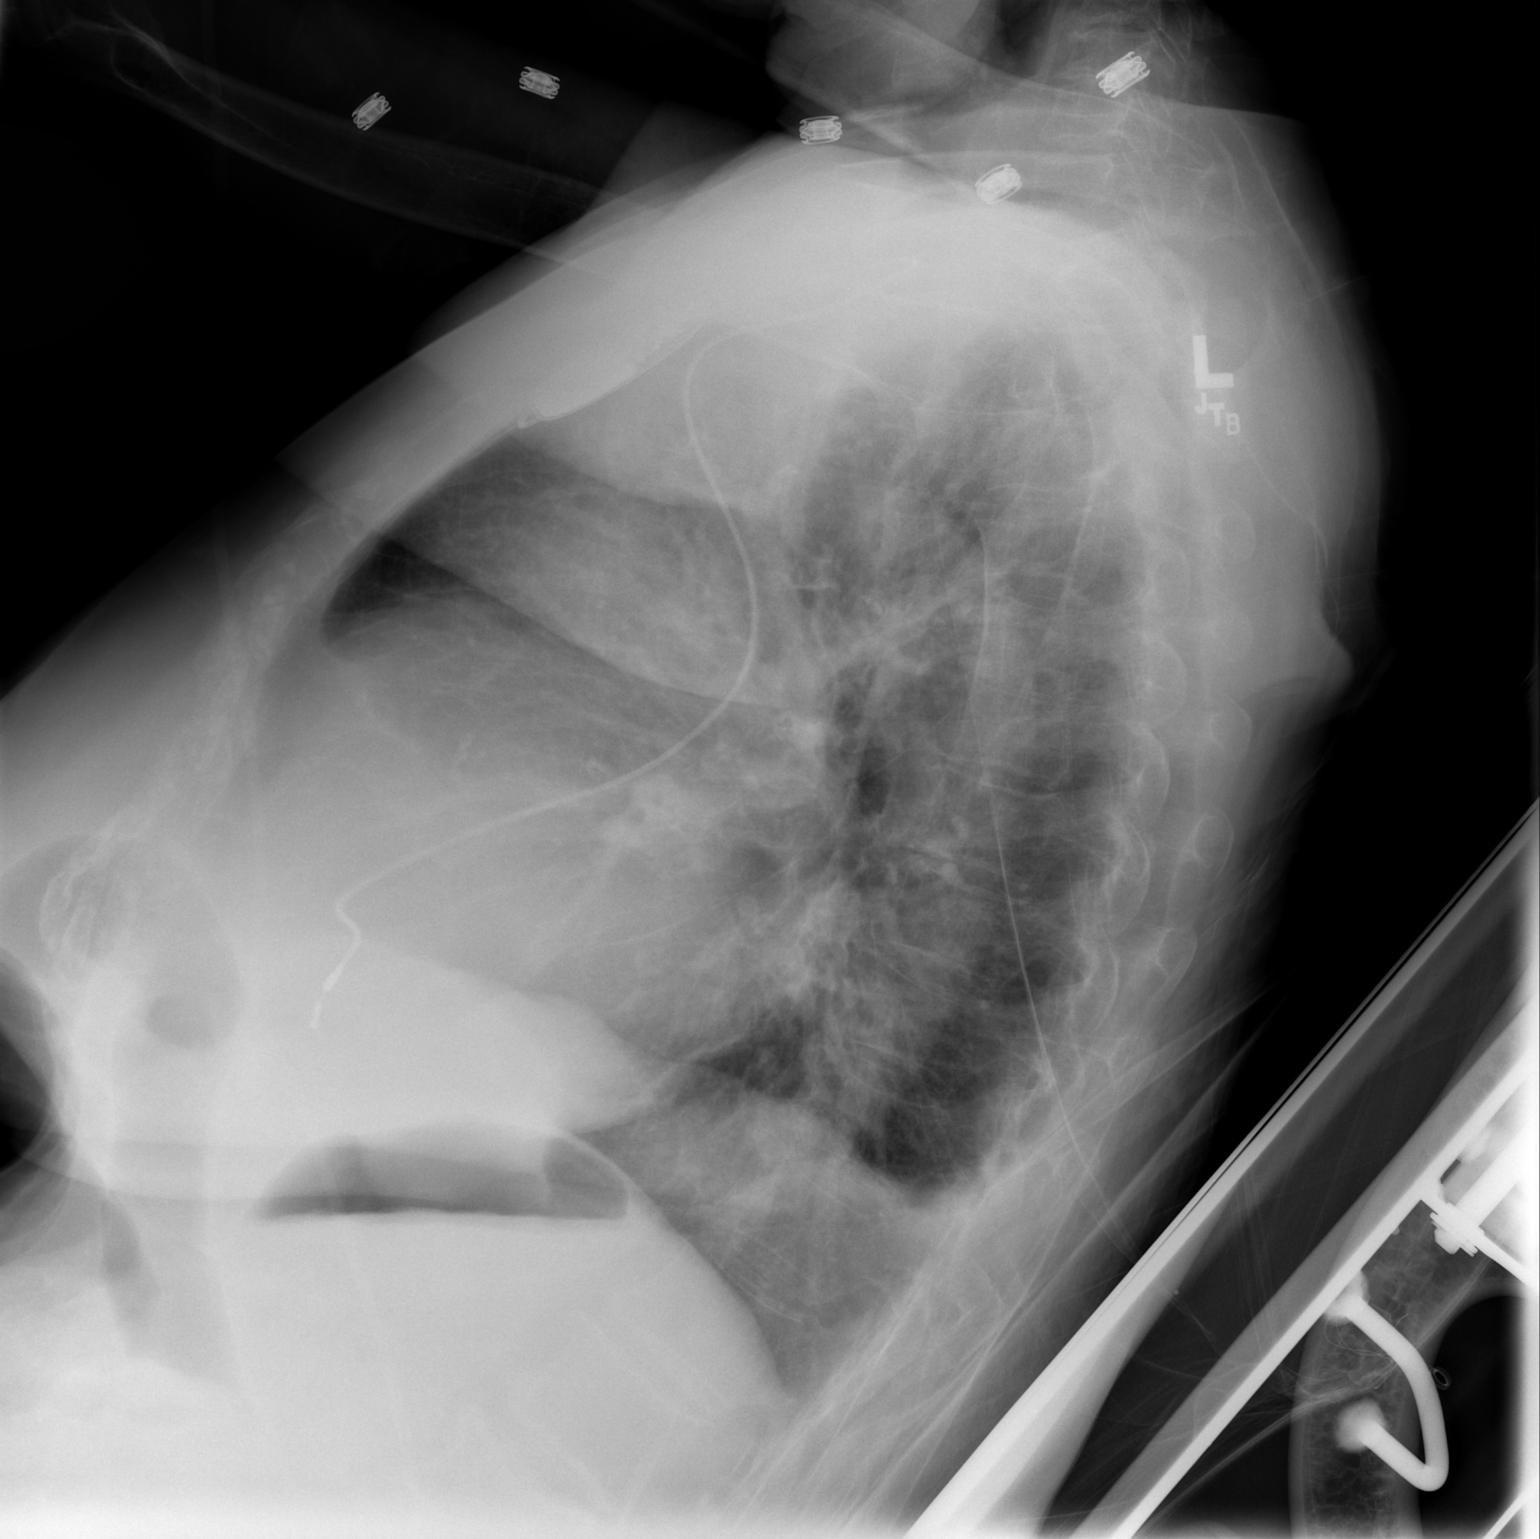

[1 of 1 positions shown; findings below may reference images not displayed]

FINDINGS: There is chronic cardiomegaly.  The pulmonary vascularity
is within normal limits.  Transvenous pacer is unchanged.  There is
minimal atelectasis at the right lung base with a tiny right
effusion.

There is chronic peribronchial thickening consistent with chronic
bronchitis.  No significant bony abnormality.
IMPRESSION: 1.  Chronic bronchitic changes.
2.  Minimal atelectasis at the right lung base with a tiny right
effusion.

## 2011-10-10 HISTORY — PX: US ECHOCARDIOGRAPHY: HXRAD669

## 2012-09-30 ENCOUNTER — Encounter: Payer: Self-pay | Admitting: *Deleted

## 2012-10-01 ENCOUNTER — Ambulatory Visit (INDEPENDENT_AMBULATORY_CARE_PROVIDER_SITE_OTHER): Payer: PRIVATE HEALTH INSURANCE | Admitting: Cardiovascular Disease

## 2012-10-01 ENCOUNTER — Other Ambulatory Visit: Payer: Self-pay | Admitting: Cardiovascular Disease

## 2012-10-01 ENCOUNTER — Encounter: Payer: Self-pay | Admitting: Cardiovascular Disease

## 2012-10-01 VITALS — BP 122/80 | HR 60 | Resp 16 | Ht 66.5 in | Wt 164.4 lb

## 2012-10-01 DIAGNOSIS — Z950 Presence of cardiac pacemaker: Secondary | ICD-10-CM | POA: Insufficient documentation

## 2012-10-01 DIAGNOSIS — I5022 Chronic systolic (congestive) heart failure: Secondary | ICD-10-CM

## 2012-10-01 DIAGNOSIS — I4891 Unspecified atrial fibrillation: Secondary | ICD-10-CM

## 2012-10-01 DIAGNOSIS — I2510 Atherosclerotic heart disease of native coronary artery without angina pectoris: Secondary | ICD-10-CM

## 2012-10-01 DIAGNOSIS — I10 Essential (primary) hypertension: Secondary | ICD-10-CM

## 2012-10-01 LAB — PACEMAKER DEVICE OBSERVATION
BRDY-0002RV: 60 {beats}/min
BRDY-0004RV: 120 {beats}/min
RV LEAD THRESHOLD: 0.375 V

## 2012-10-01 NOTE — Assessment & Plan Note (Signed)
Other than the mild ankle swelling, she has no signs or symptoms of heart failure/hypervolemia. She is on a relatively low dose of loop diuretics. She is on appropriate treatment with angiotensin receptor blocker and beta blocker. I do not think her blood pressure permits additional increase in this medication. It is impossible to assess her functional status is that she is essentially wheelchair-bound. Notes mild to moderate mitral sufficiency, moderate tricuspid insufficiency and moderate pulmonary hypertension at the time of her last echocardiogram in 2013

## 2012-10-01 NOTE — Assessment & Plan Note (Signed)
Some control. It is possible that the amlodipine is contributing to her ankle edema. The patient's family's concern that kidney problems related to previous kidney cancer might be an issue. Will check with her nursing facility to see if lab work has been recently performed. Renal function studies and liver function tests and potassium levels should be checked at least twice yearly

## 2012-10-01 NOTE — Progress Notes (Signed)
In office pacemaker interrogation. Normal device function. No changes made this session. 

## 2012-10-01 NOTE — Progress Notes (Signed)
Patient ID: [name], female   DOB: 1928/04/08, 79 y.o.   MRN: [id number]     Reason for office visit Pacemaker followup, congestive heart failure, CAD, atrial fibrillate  [name] has progressive dementia and remains a nursing home resident in [place]. She has a long-standing history of multiple cardiac problems including coronary disease (anterior wall myocardial infarction and placement of drug-eluting stent to the right coronary artery 2003, chronic total occlusion of the LAD artery). Moderate ischemic cardiomyopathy with an ejection fraction of roughly 40% and well compensated heart failure. Permanent atrial fibrillation. Complete heart block, now pacemaker dependent. She has a single chamber pacemaker that was implanted and generates 2007 (Vitatron T20 SR) that has excellent function and still has more than 5 years of battery life.  The family has noticed worsening ankle edema although this is intermittent and today is not particularly severe. Mrs. Sheek has had 2 falls over the last month. Both of these were slow crumbling to the floor events rather than serious injuries. She has not had any head injury. She is on chronic warfarin anticoagulation. She does have easy bruising of her forearms. No serious bleeding events have occurred. As far as I know of there has been no history of stroke or TIA.    No Known Allergies  Current Outpatient Prescriptions  Medication Sig Dispense Refill  . albuterol-ipratropium (COMBIVENT) 18-103 MCG/ACT inhaler Inhale 2 puffs into the lungs every 6 (six) hours as needed for wheezing.      Marland Kitchen amLODipine (NORVASC) 5 MG tablet Take 5 mg by mouth daily.      Marland Kitchen docusate sodium (COLACE) 100 MG capsule Take 100 mg by mouth 2 (two) times daily as needed for constipation.      Marland Kitchen donepezil (ARICEPT) 10 MG tablet Take 10 mg by mouth at bedtime.      Marland Kitchen escitalopram (LEXAPRO) 20 MG tablet Take 20 mg by mouth daily.      Marland Kitchen ezetimibe-simvastatin (VYTORIN) 10-20 MG  per tablet Take 1 tablet by mouth at bedtime.      . furosemide (LASIX) 20 MG tablet Take 20 mg by mouth 2 (two) times daily.      . memantine (NAMENDA) 5 MG tablet Take 5 mg by mouth daily.      . metoprolol tartrate (LOPRESSOR) 25 MG tablet Take 12.5 mg by mouth 2 (two) times daily.      . nitroGLYCERIN (NITROSTAT) 0.4 MG SL tablet Place 0.4 mg under the tongue every 5 (five) minutes as needed for chest pain.      Marland Kitchen olmesartan (BENICAR) 20 MG tablet Take 20 mg by mouth 2 (two) times daily.      Marland Kitchen omeprazole (PRILOSEC) 20 MG capsule Take 20 mg by mouth daily.      Marland Kitchen oxycodone (OXY-IR) 5 MG capsule Take 5 mg by mouth 2 (two) times daily.      . Vitamin D, Ergocalciferol, (DRISDOL) 50000 UNITS CAPS capsule Take 50,000 Units by mouth every 30 (thirty) days.      Marland Kitchen warfarin (COUMADIN) 6 MG tablet Take 6 mg by mouth daily.       No current facility-administered medications for this visit.    Past Medical History  Diagnosis Date  . Sinus node dysfunction     permanent pacemaker 01/29/05-Vitatron  . Atrial fibrillation   . CAD (coronary artery disease)   . Ischemic cardiomyopathy   . GERD (gastroesophageal reflux disease)   . Renal cell carcinoma     H/O  .  Hypertension   . Peripheral neuropathy   . COPD (chronic obstructive pulmonary disease)     Past Surgical History  Procedure Laterality Date  . Coronary angioplasty with stent placement  07/14/2001    prox to mid RCA cypher stent  . Permanent pacemaker insertion  01/29/2005    Vitatron  . Cataract extraction  1980 & 1982  . US echocardiography  10/10/2011    LA & RA severely dilated,mild to mod MR & annular ca+,mild to mod.TR and pulmonary hypertension,Aortic root sclerosis/ca+  . Nm myocar perf wall motion  03/16/2010    mod. size scar in the LAD distribution.    No family history on file.  History   Social History  . Marital Status: Widowed    Spouse Name: N/A    Number of Children: N/A  . Years of Education: N/A    Occupational History  . Not on file.   Social History Main Topics  . Smoking status: Former Smoker    Quit date: 01/07/2002  . Smokeless tobacco: Not on file  . Alcohol Use: No  . Drug Use: No  . Sexual Activity: Not on file   Other Topics Concern  . Not on file   Social History Narrative  . No narrative on file    Review of systems: The patient specifically denies any chest pain at rest or with exertion, dyspnea at rest or with exertion, orthopnea, paroxysmal nocturnal dyspnea, syncope, palpitations, focal neurological deficits, intermittent claudication, lower extremity edema, unexplained weight gain, cough, hemoptysis or wheezing.  The patient also denies abdominal pain, nausea, vomiting, dysphagia, diarrhea, constipation, polyuria, polydipsia, dysuria, hematuria, frequency, urgency, abnormal bleeding or bruising, fever, chills, unexpected weight changes, mood swings, change in skin or hair texture, change in voice quality, auditory or visual problems, allergic reactions or rashes, new musculoskeletal complaints other than usual "aches and pains".   PHYSICAL EXAM BP 122/80  Pulse 60  Resp 16  Ht 5' 6.5" (1.689 m)  Wt 164 lb 6.4 oz (74.571 kg)  BMI 26.14 kg/m2  General: Alert, oriented x3, no distress Head: no evidence of trauma, PERRL, EOMI, no exophtalmos or lid lag, no myxedema, no xanthelasma; normal ears, nose and oropharynx Neck: normal jugular venous pulsations and no hepatojugular reflux; brisk carotid pulses without delay and no carotid bruits Chest: clear to auscultation, no signs of consolidation by percussion or palpation, normal fremitus, symmetrical and full respiratory excursions. Healthy left subclavian pacemaker site Cardiovascular: normal position and quality of the apical impulse, regular rhythm, normal first and second heart sounds, no rubs or gallops, grade 1-2/6 whole systolic murmur heard both at the apex and at the left lower sternal border Abdomen:  no tenderness or distention, no masses by palpation, no abnormal pulsatility or arterial bruits, normal bowel sounds, no hepatosplenomegaly Extremities: no clubbing, cyanosis; 1+ ankle bilateral edema, slightly more prominent on the left; 2+ radial, ulnar and brachial pulses bilaterally; 2+ right femoral, posterior tibial and dorsalis pedis pulses; 2+ left femoral, posterior tibial and dorsalis pedis pulses; no subclavian or femoral bruits Neurological: grossly nonfocal   EKG: atrial fibrillation, 100% ventricular pacing  Lipid Panel     Component Value Date/Time   CHOL 144 02/04/2008 1118   TRIG 139 02/04/2008 1118   HDL 47.0 02/04/2008 1118   CHOLHDL 3.1 CALC 02/04/2008 1118   VLDL 28 02/04/2008 1118   LDLCALC 69 02/04/2008 1118    BMET    Component Value Date/Time   NA 140 09/29/2008 1258   K  4.0 09/29/2008 1258   CL 102 09/29/2008 1258   CO2 29 09/29/2008 1258   GLUCOSE 130* 09/29/2008 1258   BUN 15 09/29/2008 1258   CREATININE 1.01 09/29/2008 1258   CALCIUM 9.5 09/29/2008 1258   GFRNONAA 52* 08/23/2008 0340   GFRAA  Value: >60        The eGFR has been calculated using the MDRD equation. This calculation has not been validated in all clinical situations. eGFR's persistently <60 mL/min signify possible Chronic Kidney Disease. 08/23/2008 0340     ASSESSMENT AND PLAN ATRIAL FIBRILLATION She is on appropriate anticoagulation warfarin. I do have some concern about the report of recent falls. Neither one of these was associated with serious injury. For the time being I think the benefits of warfarin still outweigh the risks, but this is a balance that should be repeatedly reassessed.  HYPERTENSION Some control. It is possible that the amlodipine is contributing to her ankle edema. The patient's family's concern that kidney problems related to previous kidney cancer might be an issue. Will check with her nursing facility to see if lab work has been recently performed. Renal function studies and  liver function tests and potassium levels should be checked at least twice yearly  CORONARY ARTERY DISEASE Status post inferior wall myocardial infarction and placement of a 3 x 30 mm Cypher stent in the right coronary artery in 2003. Known chronic total occlusion of the LAD artery. Moderate ischemic cardiomyopathy with left ventricular ejection fraction of around 40%, apical dyskinesis, inferior hypokinesis  FAILURE, SYSTOLIC HEART, CHRONIC Other than the mild ankle swelling, she has no signs or symptoms of heart failure/hypervolemia. She is on a relatively low dose of loop diuretics. She is on appropriate treatment with angiotensin receptor blocker and beta blocker. I do not think her blood pressure permits additional increase in this medication. It is impossible to assess her functional status is that she is essentially wheelchair-bound. Notes mild to moderate mitral sufficiency, moderate tricuspid insufficiency and moderate pulmonary hypertension at the time of her last echocardiogram in 2013  Pacemaker Vitatron T20 SR  January 2007, the differential for ablation with slow ventricular response. She is now pacemaker dependent and has complete heart block. There is 100% ventricular pacing the device testing today. Battery longevity remains excellent with an estimated 5.5 years till replacement indicator. Excellent ventricular lead parameters with a threshold of 0.375 V 0.4 ms pulse width, impedance of 600 ohms, there are no sensed R waves. No changes are made the device settings. She'll return to the device clinic in 6 months.  Orders Placed This Encounter  Procedures  . EKG 12-Lead   Meds ordered this encounter  Medications  . memantine (NAMENDA) 5 MG tablet    Sig: Take 5 mg by mouth daily.  Marland Kitchen omeprazole (PRILOSEC) 20 MG capsule    Sig: Take 20 mg by mouth daily.    Junious Silk, MD, [facility] [phone] office [phone]  pager

## 2012-10-01 NOTE — Patient Instructions (Addendum)
Your physician recommends that you schedule a follow-up appointment in: 6 months for a pacemaker check, and in 12 months with MD

## 2012-10-01 NOTE — Assessment & Plan Note (Signed)
She is on appropriate anticoagulation warfarin. I do have some concern about the report of recent falls. Neither one of these was associated with serious injury. For the time being I think the benefits of warfarin still outweigh the risks, but this is a balance that should be repeatedly reassessed.

## 2012-10-01 NOTE — Assessment & Plan Note (Signed)
Status post inferior wall myocardial infarction and placement of a 3 x 30 mm Cypher stent in the right coronary artery in 2003. Known chronic total occlusion of the LAD artery. Moderate ischemic cardiomyopathy with left ventricular ejection fraction of around 40%, apical dyskinesis, inferior hypokinesis

## 2012-10-01 NOTE — Assessment & Plan Note (Signed)
Vitatron T20 SR  January 2007, the differential for ablation with slow ventricular response. She is now pacemaker dependent and has complete heart block. There is 100% ventricular pacing the device testing today. Battery longevity remains excellent with an estimated 5.5 years till replacement indicator. Excellent ventricular lead parameters with a threshold of 0.375 V 0.4 ms pulse width, impedance of 600 ohms, there are no sensed R waves. No changes are made the device settings. She'll return to the device clinic in 6 months.

## 2013-01-07 ENCOUNTER — Encounter: Payer: Self-pay | Admitting: Cardiovascular Disease

## 2013-03-17 LAB — PROTIME-INR: INR: 2 — AB (ref 0.9–1.1)

## 2013-03-26 ENCOUNTER — Encounter: Payer: Self-pay | Admitting: *Deleted

## 2013-04-07 ENCOUNTER — Telehealth: Payer: Self-pay | Admitting: Cardiology

## 2013-04-07 ENCOUNTER — Ambulatory Visit (INDEPENDENT_AMBULATORY_CARE_PROVIDER_SITE_OTHER): Payer: PRIVATE HEALTH INSURANCE | Admitting: Cardiology

## 2013-04-07 ENCOUNTER — Encounter: Payer: Self-pay | Admitting: Cardiology

## 2013-04-07 VITALS — BP 122/62 | HR 60 | Ht 66.0 in | Wt 171.8 lb

## 2013-04-07 DIAGNOSIS — R0609 Other forms of dyspnea: Secondary | ICD-10-CM

## 2013-04-07 DIAGNOSIS — R609 Edema, unspecified: Secondary | ICD-10-CM

## 2013-04-07 DIAGNOSIS — R0600 Dyspnea, unspecified: Secondary | ICD-10-CM

## 2013-04-07 DIAGNOSIS — I2510 Atherosclerotic heart disease of native coronary artery without angina pectoris: Secondary | ICD-10-CM

## 2013-04-07 DIAGNOSIS — I4891 Unspecified atrial fibrillation: Secondary | ICD-10-CM

## 2013-04-07 DIAGNOSIS — Z950 Presence of cardiac pacemaker: Secondary | ICD-10-CM

## 2013-04-07 DIAGNOSIS — R0989 Other specified symptoms and signs involving the circulatory and respiratory systems: Principal | ICD-10-CM

## 2013-04-07 DIAGNOSIS — I5022 Chronic systolic (congestive) heart failure: Secondary | ICD-10-CM

## 2013-04-07 DIAGNOSIS — R600 Localized edema: Secondary | ICD-10-CM

## 2013-04-07 MED ORDER — POTASSIUM CHLORIDE ER 10 MEQ PO TBCR: 10.0000 meq | EXTENDED_RELEASE_TABLET | Freq: Every day | ORAL | Status: DC

## 2013-04-07 NOTE — Telephone Encounter (Signed)
Discuss with Ellen Henri PA-  PER OKAY TO HAVE ECHO DONE IN [place]   SPOKE to [place]  (pt's daughter) , RN informed her that echo can be done in [place]--- dgtr will bring patient to her appointment --the patient lives at an assisted living

## 2013-04-07 NOTE — Progress Notes (Signed)
Patient ID: ABEEHA TWIST, female   DOB: May 21, 1928, 80 y.o.   MRN: [id number]    04/12/2013 [name]   1928-07-10  [id number]  Primary Physicia [name],NANCY, MD Primary Cardiologist: Dr. Sallyanne [name]  HPI:  [name] is a 80 y/o female, followed by Dr. Sallyanne [name], who presents to clinic today for a pacemaker check, and also presents with a complaint of progressively worsening bilateral LEE.  She has progressive dementia and remains a nursing home resident in [place]. She has a long-standing history of multiple cardiac problems including coronary disease (anterior wall myocardial infarction and placement of drug-eluting stent to the right coronary artery in 2003, chronic total occlusion of the LAD artery). Moderate ischemic cardiomyopathy with an ejection fraction of roughly 40%. She also has permanent atrial fibrillation and complete heart block, now pacemaker dependent. She has a single chamber, [employer]tronic pacemaker that was implanted and generates 2007 (Vitatron T20 SR) that has excellent function and still has more than 5 years of battery life.  She is acompained today by her daughter. She states the swelling has progressively worsened over the last week. The swelling extends from her feet to her knees. Her daughter states that she has never had swelling to this extent before. She also notes DOE and some mild orthopnea. She denies chest pain.    Current Outpatient Prescriptions  Medication Sig Dispense Refill  . acetaminophen (TYLENOL) 325 MG tablet Take 650 mg by mouth every 6 (six) hours as needed.      Marland Kitchen albuterol (ACCUNEB) 0.63 MG/3ML nebulizer solution Take 1 ampule by nebulization every 6 (six) hours as needed for wheezing.      . Alum & Mag Hydroxide-Simeth (MYLANTA DOUBLE-STRENGTH PO) Take 30 mLs by mouth every 6 (six) hours as needed.      Marland Kitchen amLODipine (NORVASC) 5 MG tablet Take 5 mg by mouth daily.      Marland Kitchen CRANBERRY PO Take 30 mLs by mouth daily.      Marland Kitchen docusate sodium (COLACE)  100 MG capsule Take 100 mg by mouth 2 (two) times daily as needed for constipation.      Marland Kitchen donepezil (ARICEPT) 10 MG tablet Take 10 mg by mouth at bedtime.      Marland Kitchen escitalopram (LEXAPRO) 20 MG tablet Take 20 mg by mouth daily.      Marland Kitchen ezetimibe-simvastatin (VYTORIN) 10-20 MG per tablet Take 1 tablet by mouth at bedtime.      . furosemide (LASIX) 20 MG tablet Take 20 mg by mouth 2 (two) times daily.      . memantine (NAMENDA) 5 MG tablet Take 5 mg by mouth daily.      . metoprolol tartrate (LOPRESSOR) 25 MG tablet Take 12.5 mg by mouth 2 (two) times daily.      . nitroGLYCERIN (NITROSTAT) 0.4 MG SL tablet Place 0.4 mg under the tongue every 5 (five) minutes as needed for chest pain.      Marland Kitchen olmesartan (BENICAR) 20 MG tablet Take 20 mg by mouth 2 (two) times daily.      Marland Kitchen omeprazole (PRILOSEC) 20 MG capsule Take 20 mg by mouth daily.      Marland Kitchen oxycodone (OXY-IR) 5 MG capsule Take 5 mg by mouth daily.       . Vitamin D, Ergocalciferol, (DRISDOL) 50000 UNITS CAPS capsule Take 50,000 Units by mouth every 30 (thirty) days.      Marland Kitchen warfarin (COUMADIN) 6 MG tablet Take 6 mg by mouth daily.      Marland Kitchen  potassium chloride (K-DUR) 10 MEQ tablet Take 1 tablet (10 mEq total) by mouth daily.  30 tablet  6   No current facility-administered medications for this visit.    No Known Allergies  History   Social History  . Marital Status: Widowed    Spouse Name: N/A    Number of Children: N/A  . Years of Education: N/A   Occupational History  . Not on file.   Social History Main Topics  . Smoking status: Former Smoker    Quit date: 01/07/2002  . Smokeless tobacco: Not on file  . Alcohol Use: No  . Drug Use: No  . Sexual Activity: Not on file   Other Topics Concern  . Not on file   Social History Narrative  . No narrative on file     Review of Systems: General: negative for chills, fever, night sweats or weight changes.  Cardiovascular: negative for chest pain, dyspnea on exertion, edema, orthopnea,  palpitations, paroxysmal nocturnal dyspnea or shortness of breath Dermatological: negative for rash Respiratory: negative for cough or wheezing Urologic: negative for hematuria Abdominal: negative for nausea, vomiting, diarrhea, bright red blood per rectum, melena, or hematemesis Neurologic: negative for visual changes, syncope, or dizziness All other systems reviewed and are otherwise negative except as noted above.    Blood pressure 122/62, pulse 60, height 5\' 6"  (1.676 m), weight 171 lb 12.8 oz (77.928 kg).  General appearance: alert, cooperative and no distress Neck: + mild JVD Lungs: faint RLL rales Heart: regular rate and rhythm, S1, S2 normal, no murmur, click, rub or gallop Extremities: 2+ bilateral LEE Pulses: 2+ and symmetric Skin: Skin color, texture, turgor normal. No rashes or lesions Neurologic: Grossly normal  EKG V paced 60 bpm  ASSESSMENT AND PLAN:   Pacemaker Pacer Interrogation today reveals normal device functioning. No events since last interrogation. She is paced 100% of the time. Battery longevity is 5 yrs.   Chronic systolic heart failure Significant 2+ bilateral LEE noted with faint rales in the RLL. Will increase lasix to 40 mg BID x 3 days. I have ordered for pt to continue use of compression stockings. Orders were sent to SNF for pt to elevate legs above the level of the heart 2x/day for 30-60 minutes for edema. She has been ordered  2gm Na diet restriction, and daily weights. Nursing facility is to call office for dosing instuctions if pt gains more than 3lb in a 24 hr periord. Will repeat a 2D echo.     PLAN  Pacemaker function is normal w/o events that would explain her SOB. Physical exam supports likely acute on chronic CHF. I believe she is stable enough to be managed as an OP. Will increase lasix as outlined above. Recheck 2D echo (last was in 2013). HF management instructions were sent to SNF as outlined above. Pt instructed to f/u in 1-2 weeks.    [name], [name] 04/12/2013 10:54 PM

## 2013-04-07 NOTE — Assessment & Plan Note (Addendum)
Pacer Interrogation today reveals normal device functioning. No events since last interrogation. She is paced 100% of the time. Battery longevity is 5 yrs.

## 2013-04-07 NOTE — Assessment & Plan Note (Addendum)
Significant 2+ bilateral LEE noted with faint rales in the RLL. Will increase lasix to 40 mg BID x 3 days. I have ordered for pt to continue use of compression stockings. Orders were sent to SNF for pt to elevate legs above the level of the heart 2x/day for 30-60 minutes for edema. She has been ordered  2gm Na diet restriction, and daily weights. Nursing facility is to call office for dosing instuctions if pt gains more than 3lb in a 24 hr periord. Will repeat a 2D echo.

## 2013-04-07 NOTE — Telephone Encounter (Signed)
Pt need an echo,she wants to know if she could  have her echo in [place]? It is just so much easier for her to transport her there.

## 2013-04-07 NOTE — Telephone Encounter (Signed)
Called and spoke to [facility] in [place]--spoke to [place] appointment schedule for 04/16/13 at 2 pm.  Called and spoke to [place] (daughter) appointment time given and phone number in case need to change appointment.

## 2013-04-07 NOTE — Patient Instructions (Addendum)
Increase Lasix to 40 mg in the am and 40 mg in the pm for the next 3 days Return to 20 mg twice a day after 3 days  Check weight daily, report weight gains of more than 2.5 lb in a 24 hr period  Low sodium salt restriction, no more than 2 gm daily  Start taking 10 mEq of potasium daily  Elevate legs 30-60 minutes 2 times daily for leg edema Patient will need close monitoring and assistance ambulating to bathroom with increase diuretic dose.  Patient will need an ultrasound of heart (echocardiogram) done. If this cannot be arranged at assisted living, then please call [phone] to arrange to be done at cardiology office.   Your physician recommends that you schedule a follow-up appointment in: 2 weeks with either Dr. Claiborne Billings or an extender.

## 2013-04-12 ENCOUNTER — Encounter: Payer: Self-pay | Admitting: Cardiology

## 2013-04-13 ENCOUNTER — Ambulatory Visit (HOSPITAL_COMMUNITY): Payer: PRIVATE HEALTH INSURANCE

## 2013-04-16 ENCOUNTER — Other Ambulatory Visit (INDEPENDENT_AMBULATORY_CARE_PROVIDER_SITE_OTHER): Payer: PRIVATE HEALTH INSURANCE

## 2013-04-16 ENCOUNTER — Other Ambulatory Visit: Payer: Self-pay

## 2013-04-16 DIAGNOSIS — I5022 Chronic systolic (congestive) heart failure: Secondary | ICD-10-CM

## 2013-04-16 DIAGNOSIS — R0609 Other forms of dyspnea: Secondary | ICD-10-CM

## 2013-04-16 DIAGNOSIS — I2510 Atherosclerotic heart disease of native coronary artery without angina pectoris: Secondary | ICD-10-CM

## 2013-04-16 DIAGNOSIS — R609 Edema, unspecified: Secondary | ICD-10-CM

## 2013-04-16 DIAGNOSIS — R0989 Other specified symptoms and signs involving the circulatory and respiratory systems: Principal | ICD-10-CM

## 2013-04-16 DIAGNOSIS — I059 Rheumatic mitral valve disease, unspecified: Secondary | ICD-10-CM

## 2013-04-21 ENCOUNTER — Ambulatory Visit (INDEPENDENT_AMBULATORY_CARE_PROVIDER_SITE_OTHER): Payer: PRIVATE HEALTH INSURANCE | Admitting: Cardiology

## 2013-04-21 VITALS — BP 122/84 | HR 62 | Ht 66.0 in | Wt 157.0 lb

## 2013-04-21 DIAGNOSIS — I5022 Chronic systolic (congestive) heart failure: Secondary | ICD-10-CM

## 2013-04-21 MED ORDER — POTASSIUM CHLORIDE CRYS ER 20 MEQ PO TBCR: 20.0000 meq | EXTENDED_RELEASE_TABLET | Freq: Every day | ORAL | Status: AC

## 2013-04-21 NOTE — Patient Instructions (Signed)
Medication Changes Take your Lasix 20mg  in the MORNING and 20mg  in the EVENING Potassium 20mg  ONCE DAILY  Your physician recommends that you weigh, daily, at the same time every day, and in the same amount of clothing. Please record your daily weights and bring it to your next appointment. If you gain more that 3 POUNDS in a 24 HOUR period, change Lasix to 40mg  in the morning and evening for 2 days, then resume to normal dosage.   Your physician recommends that you schedule a follow-up appointment in: 4 months with Dr. Sallyanne Kuster

## 2013-04-26 ENCOUNTER — Encounter: Payer: Self-pay | Admitting: Cardiology

## 2013-04-26 NOTE — Progress Notes (Signed)
Patient ID: [name], female   DOB: 1929/01/07, 80 y.o.   MRN: [id number]     04/26/2013 [name]   1928-04-20  [id number]  Primary Physician: Margarita Rana, MD Primary Cardiologist: Dr. Sallyanne Kuster   HPI:  [name] is a 80 y/o female, followed by Dr. Sallyanne Kuster. She has progressive dementia and remains a nursing home resident in [place]. She has a long-standing history of multiple cardiac problems including coronary disease (anterior wall myocardial infarction and placement of drug-eluting stent to the right coronary artery in 2003, chronic total occlusion of the LAD artery). Moderate ischemic cardiomyopathy with an ejection fraction of roughly 40%. She also has permanent atrial fibrillation and complete heart block, now pacemaker dependent. She has a single chamber, [employer] pacemaker that was implanted and generates 2007 (Vitatron T20 SR) that has excellent function and still has more than 5 years of battery life.   Pt returns to clinic today for f/u regarding her HF. I evaluated her 2 weeks ago. At that time she noted progressive worsening of bilateral LEE and mild DOE and orthopnea. I opted to increase her lasix to 40 mg BID, as well as potassium x 3 days. I also ordered a repeat 2D echo to assess for any changes in systolic function.  She is accompanied today by her daughter. She reports significant improvement. She no longer feels SOB when walking. No further orthopnea and LEE has also improved. 2D echo shows no significant change since last study. EF is 35-40%.   Current Outpatient Prescriptions  Medication Sig Dispense Refill  . acetaminophen (TYLENOL) 325 MG tablet Take 650 mg by mouth every 6 (six) hours as needed.      Marland Kitchen albuterol (ACCUNEB) 0.63 MG/3ML nebulizer solution Take 1 ampule by nebulization every 6 (six) hours as needed for wheezing.      . Alum & Mag Hydroxide-Simeth (MYLANTA DOUBLE-STRENGTH PO) Take 30 mLs by mouth every 6 (six) hours as needed.      Marland Kitchen  amLODipine (NORVASC) 5 MG tablet Take 5 mg by mouth daily.      Marland Kitchen CRANBERRY PO Take 30 mLs by mouth daily.      Marland Kitchen docusate sodium (COLACE) 100 MG capsule Take 100 mg by mouth 2 (two) times daily as needed for constipation.      Marland Kitchen donepezil (ARICEPT) 10 MG tablet Take 10 mg by mouth at bedtime.      Marland Kitchen escitalopram (LEXAPRO) 20 MG tablet Take 20 mg by mouth daily.      Marland Kitchen ezetimibe-simvastatin (VYTORIN) 10-20 MG per tablet Take 1 tablet by mouth at bedtime.      . furosemide (LASIX) 20 MG tablet Take 20 mg by mouth 2 (two) times daily.      . memantine (NAMENDA) 5 MG tablet Take 5 mg by mouth daily.      . metoprolol tartrate (LOPRESSOR) 25 MG tablet Take 12.5 mg by mouth 2 (two) times daily.      . nitroGLYCERIN (NITROSTAT) 0.4 MG SL tablet Place 0.4 mg under the tongue every 5 (five) minutes as needed for chest pain.      Marland Kitchen olmesartan (BENICAR) 20 MG tablet Take 20 mg by mouth 2 (two) times daily.      Marland Kitchen omeprazole (PRILOSEC) 20 MG capsule Take 20 mg by mouth daily.      Marland Kitchen oxycodone (OXY-IR) 5 MG capsule Take 5 mg by mouth daily.       . Vitamin D, Ergocalciferol, (DRISDOL) 50000 UNITS CAPS capsule Take  50,000 Units by mouth every 30 (thirty) days.      Marland Kitchen warfarin (COUMADIN) 6 MG tablet Take 6 mg by mouth daily.      . potassium chloride SA (K-DUR,KLOR-CON) 20 MEQ tablet Take 1 tablet (20 mEq total) by mouth daily.  90 tablet  3   No current facility-administered medications for this visit.    No Known Allergies  History   Social History  . Marital Status: Widowed    Spouse Name: N/A    Number of Children: N/A  . Years of Education: N/A   Occupational History  . Not on file.   Social History Main Topics  . Smoking status: Former Smoker    Quit date: 01/07/2002  . Smokeless tobacco: Not on file  . Alcohol Use: No  . Drug Use: No  . Sexual Activity: Not on file   Other Topics Concern  . Not on file   Social History Narrative  . No narrative on file     Review of  Systems: General: negative for chills, fever, night sweats or weight changes.  Cardiovascular: negative for chest pain, dyspnea on exertion, edema, orthopnea, palpitations, paroxysmal nocturnal dyspnea or shortness of breath Dermatological: negative for rash Respiratory: negative for cough or wheezing Urologic: negative for hematuria Abdominal: negative for nausea, vomiting, diarrhea, bright red blood per rectum, melena, or hematemesis Neurologic: negative for visual changes, syncope, or dizziness All other systems reviewed and are otherwise negative except as noted above.    Blood pressure 122/84, pulse 62, height 5\' 6"  (1.676 m), weight 157 lb (71.215 kg).  General appearance: alert, cooperative and no distress Neck: no JVD Lungs: faint RLL rales Heart: regular rate and rhythm, S1, S2 normal, no murmur, click, rub or gallop Extremities: trave bilateral LEE Pulses: 2+ and symmetric Skin: Skin color, texture, turgor normal. No rashes or lesions Neurologic: Grossly normal  EKG V paced 60 bpm  ASSESSMENT AND PLAN:   Chronic systolic heart failure Volume status improved. Symptoms resolved. No significant change in EF compared to last assessment. EF 35-40%. Continue medical therapy: BB, ARB and diuretic. Will reduce Lasix back down to 20 mg BID. Pt was provided sliding scale instructions based on weight. Encouraged low sodium diet and daily weights.     PLAN Pt improved. Lasix reduced back down to original dose. Continue all other meds as prescribed.   Rima Blizzard SimmonsPA-C 04/26/2013 10:40 PM

## 2013-04-26 NOTE — Assessment & Plan Note (Signed)
Volume status improved. Symptoms resolved. No significant change in EF compared to last assessment. EF 35-40%. Continue medical therapy: BB, ARB and diuretic. Will reduce Lasix back down to 20 mg BID. Pt was provided sliding scale instructions based on weight. Encouraged low sodium diet and daily weights.

## 2013-05-12 ENCOUNTER — Telehealth: Payer: Self-pay | Admitting: Cardiovascular Disease

## 2013-05-12 NOTE — Telephone Encounter (Signed)
Was instructed to call the doctor if Mrs.Branca had a weight gain. She has had a 8lb weight gain and wanted to know if he wanted to have them monitor her .Marland Kitchen Over the weekend she weighed 150.2 and this morning she weighed in at 157lbs.. Please call   Thanks

## 2013-05-12 NOTE — Telephone Encounter (Signed)
Understand. Yes, please monitor and keep on usual furosemide dose. Cane we please check a BMET and BNP [facility]

## 2013-05-12 NOTE — Telephone Encounter (Signed)
At [facility] on 4.14.15, Lyda Jester, PA-C instructed pt to take Lasix 40 mg BID x 2 days and resume regular dose, if 3 lbs gained in 24 hours.  Wt gain is being reported now at 6.8 lb gain since the weekend.    Returned call to [place], [employer].  Informed message received.  Stated pt's wt is normally 156 - 157 lbs.  Stated tech that weighed pt over the weekend recorded 149 and 150.2 lbs and today wt is 157.2 lbs.  Denied pt w/ swelling or SOB.  Stated the nurse evaluated her and said her lungs are clear.  Wanted to know if pt should be monitored for a few days b/c she doesn't think the weekend tech weighed pt correctly, as this is a new tech.  Informed MD will be notified and RN will call back.  Message forwarded to Dr. Sallyanne Kuster for further instructions.

## 2013-05-12 NOTE — Telephone Encounter (Signed)
Call to [place], [employer].  Advice and orders given per MD.  Orders repeated.  Verbalized understanding.

## 2013-07-29 ENCOUNTER — Encounter: Payer: Self-pay | Admitting: Cardiovascular Disease

## 2013-10-16 ENCOUNTER — Encounter: Payer: Self-pay | Admitting: *Deleted

## 2013-12-02 ENCOUNTER — Telehealth: Payer: Self-pay | Admitting: Cardiovascular Disease

## 2013-12-02 NOTE — Telephone Encounter (Signed)
New message      Pt is in skilled nursing care.  Daughter want to know if they can get a box and have her pacemaker checked there instead of bringing her here each time.  Please call

## 2013-12-02 NOTE — Telephone Encounter (Signed)
Daughter informed that her mother's device is not capable of sending remote transmissions. Daughter voiced understanding.

## 2013-12-22 ENCOUNTER — Ambulatory Visit (INDEPENDENT_AMBULATORY_CARE_PROVIDER_SITE_OTHER): Payer: PRIVATE HEALTH INSURANCE | Admitting: Cardiovascular Disease

## 2013-12-22 ENCOUNTER — Encounter: Payer: Self-pay | Admitting: Cardiovascular Disease

## 2013-12-22 VITALS — BP 110/62 | HR 60 | Ht 66.0 in | Wt 150.0 lb

## 2013-12-22 DIAGNOSIS — G309 Alzheimer's disease, unspecified: Secondary | ICD-10-CM

## 2013-12-22 DIAGNOSIS — I442 Atrioventricular block, complete: Secondary | ICD-10-CM

## 2013-12-22 DIAGNOSIS — I5022 Chronic systolic (congestive) heart failure: Secondary | ICD-10-CM

## 2013-12-22 DIAGNOSIS — F0280 Dementia in other diseases classified elsewhere without behavioral disturbance: Secondary | ICD-10-CM

## 2013-12-22 DIAGNOSIS — I10 Essential (primary) hypertension: Secondary | ICD-10-CM

## 2013-12-22 DIAGNOSIS — I2510 Atherosclerotic heart disease of native coronary artery without angina pectoris: Secondary | ICD-10-CM

## 2013-12-22 DIAGNOSIS — I4891 Unspecified atrial fibrillation: Secondary | ICD-10-CM

## 2013-12-22 DIAGNOSIS — Z950 Presence of cardiac pacemaker: Secondary | ICD-10-CM

## 2013-12-22 LAB — MDC_IDC_ENUM_SESS_TYPE_INCLINIC
Battery Remaining Longevity: 4
Lead Channel Impedance Value: 600 Ohm
Lead Channel Pacing Threshold Amplitude: 0.5 V
Lead Channel Pacing Threshold Pulse Width: 0.4 ms
Lead Channel Setting Pacing Amplitude: 2 V
Lead Channel Setting Pacing Pulse Width: 0.4 ms
MDC IDC PG SERIAL: 2806008509
MDC IDC SET LEADCHNL RV SENSING SENSITIVITY: 2 mV
MDC IDC STAT BRADY RV PERCENT PACED: 100 %

## 2013-12-22 NOTE — Patient Instructions (Signed)
Dr. Sallyanne Kuster recommends that you schedule a follow-up appointment in: 6 months with pacemaker check.

## 2013-12-23 ENCOUNTER — Encounter: Payer: Self-pay | Admitting: Cardiovascular Disease

## 2013-12-23 DIAGNOSIS — I442 Atrioventricular block, complete: Secondary | ICD-10-CM

## 2013-12-23 HISTORY — DX: Atrioventricular block, complete: I44.2

## 2013-12-23 NOTE — Progress Notes (Signed)
Patient ID: [name], female   DOB: March 16, 1928, 80 y.o.   MRN: [id number]      Reason for office visit Pacemaker followup, congestive heart failure, CAD, atrial fibrillate  [name] has progressive dementia and is now a resident in the skilled nursing portion of her facility in [place]. She has a long-standing history of multiple cardiac problems including coronary disease (anterior wall myocardial infarction and placement of drug-eluting stent to the right coronary artery 2003, chronic total occlusion of the LAD artery). Moderate ischemic cardiomyopathy with an ejection fraction of roughly 40% and well compensated heart failure. Permanent atrial fibrillation. Complete heart block, now pacemaker dependent. She has a single chamber pacemaker that was implanted and generates 2007 (Vitatron T20 SR) that has excellent function and still has about 4 years of battery life.  She is here for pacemaker check and has no complaints. Her daughter describes her worsening cognitive function and how it is more difficult to transport her outside of the nursing facility. Unfortunately, her pacemaker is not amenable to remote monitoring.  No Known Allergies  Current Outpatient Prescriptions  Medication Sig Dispense Refill  . acetaminophen (TYLENOL) 325 MG tablet Take 650 mg by mouth every 6 (six) hours as needed.    Marland Kitchen albuterol (ACCUNEB) 0.63 MG/3ML nebulizer solution Take 1 ampule by nebulization every 6 (six) hours as needed for wheezing.    . Alum & Mag Hydroxide-Simeth (MYLANTA DOUBLE-STRENGTH PO) Take 30 mLs by mouth every 6 (six) hours as needed.    Marland Kitchen amLODipine (NORVASC) 5 MG tablet Take 5 mg by mouth daily.    Marland Kitchen CRANBERRY PO Take 30 mLs by mouth daily.    Marland Kitchen docusate sodium (COLACE) 100 MG capsule Take 100 mg by mouth 2 (two) times daily as needed for constipation.    Marland Kitchen donepezil (ARICEPT) 10 MG tablet Take 10 mg by mouth at bedtime.    Marland Kitchen escitalopram (LEXAPRO) 20 MG tablet Take 20 mg by mouth  daily.    Marland Kitchen ezetimibe-simvastatin (VYTORIN) 10-20 MG per tablet Take 1 tablet by mouth at bedtime.    . furosemide (LASIX) 20 MG tablet Take 20 mg by mouth daily. Take $RemoveBef'40mg'pslwmMtODu$  if weight increases by 3+ lbs.    . memantine (NAMENDA) 5 MG tablet Take 5 mg by mouth daily.    . metoprolol tartrate (LOPRESSOR) 25 MG tablet Take 12.5 mg by mouth 2 (two) times daily.    . nitroGLYCERIN (NITROSTAT) 0.4 MG SL tablet Place 0.4 mg under the tongue every 5 (five) minutes as needed for chest pain.    Marland Kitchen olmesartan (BENICAR) 20 MG tablet Take 20 mg by mouth 2 (two) times daily.    Marland Kitchen omeprazole (PRILOSEC) 20 MG capsule Take 20 mg by mouth daily.    Marland Kitchen oxycodone (OXY-IR) 5 MG capsule Take 5 mg by mouth daily.     . potassium chloride SA (K-DUR,KLOR-CON) 20 MEQ tablet Take 1 tablet (20 mEq total) by mouth daily. 90 tablet 3  . Vitamin D, Ergocalciferol, (DRISDOL) 50000 UNITS CAPS capsule Take 50,000 Units by mouth every 30 (thirty) days.    Marland Kitchen warfarin (COUMADIN) 6 MG tablet Take 6 mg by mouth daily.     No current facility-administered medications for this visit.    Past Medical History  Diagnosis Date  . Sinus node dysfunction     permanent pacemaker 01/29/05-Vitatron  . Atrial fibrillation   . CAD (coronary artery disease)   . Ischemic cardiomyopathy   . GERD (gastroesophageal reflux disease)   .  Renal cell carcinoma     H/O  . Hypertension   . Peripheral neuropathy   . COPD (chronic obstructive pulmonary disease)     Past Surgical History  Procedure Laterality Date  . Coronary angioplasty with stent placement  07/14/2001    prox to mid RCA cypher stent  . Permanent pacemaker insertion  01/29/2005    Vitatron  . Cataract extraction  [place]  . US echocardiography  10/10/2011    LA & RA severely dilated,mild to mod MR & annular ca+,mild to mod.TR and pulmonary hypertension,Aortic root sclerosis/ca+  . Nm myocar perf wall motion  03/16/2010    mod. size scar in the LAD distribution.    No family  history on file.  History   Social History  . Marital Status: Widowed    Spouse Name: N/A    Number of Children: N/A  . Years of Education: N/A   Occupational History  . Not on file.   Social History Main Topics  . Smoking status: Former Smoker    Quit date: 01/07/2002  . Smokeless tobacco: Not on file  . Alcohol Use: No  . Drug Use: No  . Sexual Activity: Not on file   Other Topics Concern  . Not on file   Social History Narrative    Review of systems: The patient specifically denies any chest pain at rest or with exertion, dyspnea at rest or with exertion, orthopnea, paroxysmal nocturnal dyspnea, syncope, palpitations, focal neurological deficits, intermittent claudication, lower extremity edema, unexplained weight gain, cough, hemoptysis or wheezing.  The patient also denies abdominal pain, nausea, vomiting, dysphagia, diarrhea, constipation, polyuria, polydipsia, dysuria, hematuria, frequency, urgency, abnormal bleeding or bruising, fever, chills, unexpected weight changes, mood swings, change in skin or hair texture, change in voice quality, auditory or visual problems, allergic reactions or rashes, new musculoskeletal complaints other than usual "aches and pains".  PHYSICAL EXAM BP 110/62 mmHg  Pulse 60  Ht $R'5\' 6"'rG$  (1.676 m)  Wt 150 lb (68.04 kg)  BMI 24.22 kg/m2 General: Alert, oriented x3, no distress Head: no evidence of trauma, PERRL, EOMI, no exophtalmos or lid lag, no myxedema, no xanthelasma; normal ears, nose and oropharynx Neck: normal jugular venous pulsations and no hepatojugular reflux; brisk carotid pulses without delay and no carotid bruits Chest: clear to auscultation, no signs of consolidation by percussion or palpation, normal fremitus, symmetrical and full respiratory excursions. Healthy left subclavian pacemaker site Cardiovascular: normal position and quality of the apical impulse, regular rhythm, normal first and second heart sounds, no rubs or  gallops, grade 6-2/5 whole systolic murmur heard both at the apex and at the left lower sternal border Abdomen: no tenderness or distention, no masses by palpation, no abnormal pulsatility or arterial bruits, normal bowel sounds, no hepatosplenomegaly Extremities: no clubbing, cyanosis; 1+ ankle bilateral edema, slightly more prominent on the left; 2+ radial, ulnar and brachial pulses bilaterally; 2+ right femoral, posterior tibial and dorsalis pedis pulses; 2+ left femoral, posterior tibial and dorsalis pedis pulses; no subclavian or femoral bruits Neurological: grossly nonfocal  EKG: AFib, V paced  Lipid Panel     Component Value Date/Time   CHOL 144 02/04/2008 1118   TRIG 139 02/04/2008 1118   HDL 47.0 02/04/2008 1118   CHOLHDL 3.1 CALC 02/04/2008 1118   VLDL 28 02/04/2008 1118   LDLCALC 69 02/04/2008 1118    BMET    Component Value Date/Time   NA 140 09/29/2008 1258   K 4.0 09/29/2008 1258  CL 102 09/29/2008 1258   CO2 29 09/29/2008 1258   GLUCOSE 130* 09/29/2008 1258   BUN 15 09/29/2008 1258   CREATININE 1.01 09/29/2008 1258   CALCIUM 9.5 09/29/2008 1258   GFRNONAA 52* 08/23/2008 0340   GFRAA  08/23/2008 0340    >60        The eGFR has been calculated using the MDRD equation. This calculation has not been validated in all clinical situations. eGFR's persistently <60 mL/min signify possible Chronic Kidney Disease.     ASSESSMENT AND PLAN  [name] is pacemaker dependent due to complete heart block and has a normally functioning device with and expected generator longevity of about 4 years. All her other cardiac issues appear well compensated.  Although ideally device monitoring would be done every 3 months, because of her worsening cognitive status this is a hardship. Will decrease follow up to every 6 months and need to try to synchronize device checks with her appointments with Dr. Claiborne Billings.  Orders Placed This Encounter  Procedures  . Implantable device check   . EKG 12-Lead   No orders of the defined types were placed in this encounter.    Holli Humbles, MD, [place] [phone] office [phone] pager

## 2014-01-13 ENCOUNTER — Encounter: Payer: Self-pay | Admitting: Cardiovascular Disease

## 2014-02-24 ENCOUNTER — Telehealth: Payer: Self-pay | Admitting: Cardiovascular Disease

## 2014-02-24 NOTE — Telephone Encounter (Signed)
[name] is calling in stating that the pharmacy is denying her Benicar medication and would like to know if Dr. Claiborne [name] could do a prior authorization so that she can start back taking this medication. Please f/u   Thanks

## 2014-02-24 NOTE — Telephone Encounter (Signed)
Left message to call back  

## 2014-02-26 NOTE — Telephone Encounter (Signed)
Spoke with NP. HIstorical med we do not refill. NP will handle PA or change med.

## 2014-07-09 ENCOUNTER — Encounter: Payer: Self-pay | Admitting: *Deleted

## 2014-08-03 ENCOUNTER — Encounter: Payer: Self-pay | Admitting: *Deleted

## 2014-09-12 NOTE — Progress Notes (Signed)
Electrophysiology Office Note   Date:  09/14/2014   ID:  [name], DOB [birth year], MRN [id number]  PCP:  Pcp Not In System  Cardiologist: [name]  Primary Electrophysiologist:   [name] Axe, MD    Chief Complaint  Patient presents with  . other    Follow up with pacemaker check. Meds reviewed by the patient verbally.     History of Present Illness: [name] is a 81 y.o. female is newly moved to [facility], previously followed by [facility].    Known DAC with prior AMI and stenting of RCA with T occlusion LAD; EF 40%   Hx of complete heart block, with single chamber Vitatron pacemaker (VTT20 SR)  She apparently had a problem with heart failure about a month or so ago treated with adjustments in her medications.  Her dementia is progressive. She no longer knows her daughter.  She has lost about 50 or 60 pounds in the last year and she is eating less.  Past Medical History  Diagnosis Date  . Sinus node dysfunction     permanent pacemaker 01/29/05-Vitatron  . Atrial fibrillation   . CAD (coronary artery disease)   . Ischemic cardiomyopathy   . GERD (gastroesophageal reflux disease)   . Renal cell carcinoma     H/O  . Hypertension   . Peripheral neuropathy   . COPD (chronic obstructive pulmonary disease)   . CHB (complete heart block) 12/23/2013   Past Surgical History  Procedure Laterality Date  . Coronary angioplasty with stent placement  07/14/2001    prox to mid RCA cypher stent  . Permanent pacemaker insertion  01/29/2005    Vitatron  . Cataract extraction  [place]  . US echocardiography  10/10/2011    LA & RA severely dilated,mild to mod MR & annular ca+,mild to mod.TR and pulmonary hypertension,Aortic root sclerosis/ca+  . Nm myocar perf wall motion  03/16/2010    mod. size scar in the LAD distribution.     Current Outpatient Prescriptions  Medication Sig Dispense Refill  . acetaminophen (TYLENOL) 325 MG tablet Take 650 mg by mouth every 6 (six) hours  as needed.    Marland Kitchen albuterol (ACCUNEB) 0.63 MG/3ML nebulizer solution Take 1 ampule by nebulization every 6 (six) hours as needed for wheezing.    . Alum & Mag Hydroxide-Simeth (MYLANTA DOUBLE-STRENGTH PO) Take 30 mLs by mouth every 6 (six) hours as needed.    Marland Kitchen amLODipine (NORVASC) 5 MG tablet Take 5 mg by mouth daily.    Marland Kitchen CRANBERRY PO Take 30 mLs by mouth daily.    Marland Kitchen docusate sodium (COLACE) 100 MG capsule Take 100 mg by mouth 2 (two) times daily as needed for constipation.    Marland Kitchen donepezil (ARICEPT) 10 MG tablet Take 10 mg by mouth at bedtime.    Marland Kitchen escitalopram (LEXAPRO) 20 MG tablet Take 20 mg by mouth daily.    Marland Kitchen ezetimibe-simvastatin (VYTORIN) 10-20 MG per tablet Take 1 tablet by mouth at bedtime.    . ferrous sulfate 325 (65 FE) MG tablet Take 325 mg by mouth daily with breakfast.    . furosemide (LASIX) 20 MG tablet Take 20 mg by mouth daily. Take 40mg  if weight increases by 3+ lbs.    . memantine (NAMENDA) 5 MG tablet Take 5 mg by mouth daily.    . metoprolol tartrate (LOPRESSOR) 25 MG tablet Take 12.5 mg by mouth 2 (two) times daily.    . nitroGLYCERIN (NITROSTAT) 0.4 MG SL tablet  Place 0.4 mg under the tongue every 5 (five) minutes as needed for chest pain.    Marland Kitchen olmesartan (BENICAR) 40 MG tablet Take 40 mg by mouth daily.    Marland Kitchen omeprazole (PRILOSEC) 20 MG capsule Take 20 mg by mouth daily.    Marland Kitchen oxycodone (OXY-IR) 5 MG capsule Take 5 mg by mouth daily.     . potassium chloride SA (K-DUR,KLOR-CON) 20 MEQ tablet Take 1 tablet (20 mEq total) by mouth daily. 90 tablet 3  . Vitamin D, Ergocalciferol, (DRISDOL) 50000 UNITS CAPS capsule Take 50,000 Units by mouth every 30 (thirty) days.    Marland Kitchen warfarin (COUMADIN) 6 MG tablet Take 6 mg by mouth daily.     No current facility-administered medications for this visit.    Allergies:   Review of patient's allergies indicates no known allergies.   Social History:  The patient  reports that she quit smoking about 12 years ago. She does not have any  smokeless tobacco history on file. She reports that she does not drink alcohol or use illicit drugs.   Family History:  The patient's Family history is unknown by patient.    ROS:  Please see the history of present illness and past medical history  Otherwise, all other systems were reviewed and were negative .     PHYSICAL EXAM: VS:  BP 115/58 mmHg  Pulse 68  Ht 5\' 6"  (1.676 m)  Wt 140 lb (63.504 kg)  BMI 22.61 kg/m2 , BMI Body mass index is 22.61 kg/(m^2). GEN: Well nourished, well developed, in no acute distress HEENT: normal Neck:  JVD flat, carotid bruits, or masses Cardiac: REGULAR RATE and RHYTHM ;  No murmurs, rubs,  S4  Back without kyphosis; No CVAT Respiratory:  clear to auscultation bilaterally, normal work of breathing GI: soft, nontender, nondistended, + BS MS: no deformity or atrophy Extremities no clubbing cyanosis +  edema Skin: warm and dry,   device pocket is well healed without teathering Neuro:  Strength and sensation are intact Psych: Sitting in a wheelchair and non-conversant  EKG:  EKG  ordered today. The ekg ordered today shows atrial fibrillation with ventricular pacing and frequent PVCs  Device interrogation is reviewed today in detail.  See PaceArt for details.   Recent Labs: No results found for requested labs within last 365 days.    Lipid Panel     Component Value Date/Time   CHOL 144 02/04/2008 1118   TRIG 139 02/04/2008 1118   HDL 47.0 02/04/2008 1118   CHOLHDL 3.1 CALC 02/04/2008 1118   VLDL 28 02/04/2008 1118   LDLCALC 69 02/04/2008 1118     Wt Readings from Last 3 Encounters:  09/14/14 140 lb (63.504 kg)  12/22/13 150 lb (68.04 kg)  04/21/13 157 lb (71.215 kg)      Other studies Reviewed: Additional studies/ records that were reviewed today include:    ASSESSMENT AND PLAN: Ischemic Cardiomyopathy  Complete Heart Block  Pacemaker, Vitatron  Dementia  Congestive heart failure-chronic-systolic  Euvolemic continue  current meds  Without symptoms of ischemia   Discussion with her daughter has to do with the role of pacemaker follow-up. At this point, given her mental condition, her daughter would not be interested in pursuing device generator replacement. That would seem to me to be a reasonable decision. Hence, ongoing pacemaker follow-up will be discontinued. We are available to help in any way that we can  Current medicines are reviewed at length with the patient today.   The  patient  concerns regarding her medicines.  The following changes were made today:    Labs/ tests ordered today include:  Orders Placed This Encounter  Procedures  . EKG 12-Lead    no laboratories for years are available for review  Disposition:   FU prn    Signed, [name] Axe, MD  09/14/2014 10:14 AM     [facility] [address] [place] [address] [phone] (office) [phone] (fax)

## 2014-09-14 ENCOUNTER — Encounter: Payer: Self-pay | Admitting: Internal Medicine

## 2014-09-14 ENCOUNTER — Ambulatory Visit (INDEPENDENT_AMBULATORY_CARE_PROVIDER_SITE_OTHER): Payer: Medicare Other | Admitting: Internal Medicine

## 2014-09-14 VITALS — BP 115/58 | HR 68 | Ht 66.0 in | Wt 140.0 lb

## 2014-09-14 DIAGNOSIS — I442 Atrioventricular block, complete: Secondary | ICD-10-CM

## 2014-09-14 DIAGNOSIS — Z950 Presence of cardiac pacemaker: Secondary | ICD-10-CM | POA: Diagnosis not present

## 2014-09-14 DIAGNOSIS — I4891 Unspecified atrial fibrillation: Secondary | ICD-10-CM | POA: Diagnosis not present

## 2014-09-14 NOTE — Patient Instructions (Signed)
Medication Instructions: Continue current medication.    Labwork: None   Testing/Procedures: None   Follow-Up: As needed.    Any Other Special Instructions Will Be Listed Below (If Applicable).

## 2014-09-21 LAB — CUP PACEART INCLINIC DEVICE CHECK
MDC IDC SESS DTM: 20160913150038
Pulse Gen Serial Number: 2806008509

## 2015-03-09 DEATH — deceased
